# Patient Record
Sex: Male | Born: 1980 | Hispanic: No | Marital: Married | Smoking: Current every day smoker
Health system: Southern US, Community
[De-identification: ages and names within clinical notes are randomized; demographics above are authoritative.]

## PROBLEM LIST (undated history)

## (undated) DIAGNOSIS — F32A Depression, unspecified: Secondary | ICD-10-CM

## (undated) DIAGNOSIS — K219 Gastro-esophageal reflux disease without esophagitis: Secondary | ICD-10-CM

## (undated) DIAGNOSIS — F329 Major depressive disorder, single episode, unspecified: Secondary | ICD-10-CM

## (undated) HISTORY — DX: Gastro-esophageal reflux disease without esophagitis: K21.9

---

## 2012-07-30 ENCOUNTER — Encounter: Payer: Self-pay | Admitting: Endocrinology

## 2012-07-30 ENCOUNTER — Ambulatory Visit (INDEPENDENT_AMBULATORY_CARE_PROVIDER_SITE_OTHER): Payer: Self-pay | Admitting: Endocrinology

## 2012-07-30 VITALS — BP 130/74 | HR 78 | Wt 191.0 lb

## 2012-07-30 DIAGNOSIS — N529 Male erectile dysfunction, unspecified: Secondary | ICD-10-CM

## 2012-07-30 NOTE — Progress Notes (Signed)
  Subjective:    Patient ID: Paul Coleman, male    DOB: 1980-11-26, 32 y.o.   MRN: [id number]  HPI Pt states 6-12 mos of slight swelling at the breast areas bilaterally, and assoc fatigue.   Past Medical History  Diagnosis Date  . GERD (gastroesophageal reflux disease)     History reviewed. No pertinent past surgical history.  History   Social History  . Marital Status: Married    Spouse Name: N/A    Number of Children: N/A  . Years of Education: N/A   Occupational History  . Not on file.   Social History Main Topics  . Smoking status: Former Games developer  . Smokeless tobacco: Not on file  . Alcohol Use: No  . Drug Use: No  . Sexually Active: Not on file   Other Topics Concern  . Not on file   Social History Narrative  . No narrative on file    No current outpatient prescriptions on file prior to visit.   No current facility-administered medications on file prior to visit.    Not on File  History reviewed. No pertinent family history.  BP 130/74  Pulse 78  Wt 191 lb (86.637 kg)  SpO2 98%    Review of Systems denies numbness, fever, dysuria, easy bruising, sob, rash, blurry vision, rhinorrhea, chest pain.  He has urinary frequency, ED sxs, weight gain, muscle weakness, headache, excessive diaphoresis, and depression.    Objective:   Physical Exam VS: see vs page GEN: no distress HEAD: head: no deformity eyes: no periorbital swelling, no proptosis.   external nose and ears are normal mouth: no lesion seen NECK: supple, thyroid is not enlarged CHEST WALL: no deformity LUNGS: clear to auscultation BREASTS:  Slight bilat pseudogynecomastia CV: reg rate and rhythm, no murmur ABD: abdomen is soft, nontender.  no hepatosplenomegaly.  not distended.  no hernia GENITALIA:  Normal male.   MUSCULOSKELETAL: muscle bulk and strength are grossly normal.  no obvious joint swelling.  gait is normal and steady EXTEMITIES: no deformity.  no edema PULSES: dorsalis  pedis intact bilat.  no carotid bruit NEURO:  cn 2-12 grossly intact.   readily moves all 4's.  sensation is intact to touch on all 4's SKIN:  Normal texture and temperature.  No rash or suspicious lesion is visible.  Normal hair distribution. NODES:  None palpable at the neck PSYCH: alert, oriented x3.  Does not appear anxious nor depressed.  Lab Results  Component Value Date   TESTOSTERONE 273.73* 07/30/2012      Assessment & Plan:  Hypogonadism, new, uncertain etiology Mild sensation at the breast areas, possibly due to hypogonadism ED sxs, possibly due to hypogonadism

## 2012-07-30 NOTE — Patient Instructions (Addendum)
blood tests are being requested for you today.  We'll contact you with results.  

## 2012-08-04 ENCOUNTER — Ambulatory Visit: Payer: Self-pay

## 2012-08-04 DIAGNOSIS — E291 Testicular hypofunction: Secondary | ICD-10-CM

## 2012-08-05 LAB — LUTEINIZING HORMONE: LH: 6.92 m[IU]/mL (ref 1.50–9.30)

## 2012-08-07 ENCOUNTER — Telehealth: Payer: Self-pay | Admitting: Endocrinology

## 2012-08-07 ENCOUNTER — Other Ambulatory Visit: Payer: Self-pay | Admitting: Endocrinology

## 2012-08-07 MED ORDER — CLOMIPHENE CITRATE 50 MG PO TABS: ORAL_TABLET | ORAL | Status: DC

## 2012-08-07 NOTE — Telephone Encounter (Signed)
[facility]  advised

## 2012-08-07 NOTE — Telephone Encounter (Signed)
[facility] [place]

## 2012-08-07 NOTE — Telephone Encounter (Signed)
i sent rx 

## 2012-08-07 NOTE — Telephone Encounter (Signed)
please call patient: i need to know your pharmacy, so i can send rx

## 2012-08-07 NOTE — Telephone Encounter (Signed)
This is the starting dosage in men

## 2012-08-07 NOTE — Telephone Encounter (Signed)
Per pharmacist this rx is ususally not prescribed in males and dosage is ususally 25-50?, please advise

## 2012-08-07 NOTE — Telephone Encounter (Signed)
please call regarding script, needs to confirm dosage. Please call [facility] [phone].

## 2012-08-10 MED ORDER — CLOMIPHENE CITRATE 50 MG PO TABS: ORAL_TABLET | ORAL | Status: DC

## 2012-09-05 ENCOUNTER — Ambulatory Visit (INDEPENDENT_AMBULATORY_CARE_PROVIDER_SITE_OTHER): Payer: BC Managed Care – PPO | Admitting: Endocrinology

## 2012-09-05 ENCOUNTER — Encounter: Payer: Self-pay | Admitting: Endocrinology

## 2012-09-05 VITALS — BP 134/80 | HR 110 | Wt 185.0 lb

## 2012-09-05 DIAGNOSIS — E291 Testicular hypofunction: Secondary | ICD-10-CM

## 2012-09-05 LAB — HCG, QUANTITATIVE, PREGNANCY: hCG, Beta Chain, Quant, S: 0.37 m[IU]/mL

## 2012-09-05 LAB — TESTOSTERONE: Testosterone: 563.74 ng/dL (ref 350.00–890.00)

## 2012-09-05 NOTE — Patient Instructions (Addendum)
blood tests are being requested for you today.  We'll contact you with results.   Please return in 1 year.   

## 2012-09-05 NOTE — Progress Notes (Signed)
  Subjective:    Patient ID: Paul Coleman, male    DOB: 21-Jul-1980, 32 y.o.   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  He feels slightly better in general including ED sxs.   Past Medical History  Diagnosis Date  . GERD (gastroesophageal reflux disease)     No past surgical history on file.  History   Social History  . Marital Status: Married    Spouse Name: N/A    Number of Children: N/A  . Years of Education: N/A   Occupational History  . Not on file.   Social History Main Topics  . Smoking status: Former Games developer  . Smokeless tobacco: Not on file  . Alcohol Use: No  . Drug Use: No  . Sexually Active: Not on file   Other Topics Concern  . Not on file   Social History Narrative  . No narrative on file    Current Outpatient Prescriptions on File Prior to Visit  Medication Sig Dispense Refill  . clomiPHENE (CLOMID) 50 MG tablet 1/4 tab daily  10 tablet  11   No current facility-administered medications on file prior to visit.   Not on File  No family history on file.  BP 134/80  Pulse 110  Wt 185 lb (83.915 kg)  SpO2 97%  Review of Systems Denies breast pain.     Objective:   Physical Exam VITAL SIGNS:  See vs page. GENERAL: no distress. Ext: no edema.     Assessment & Plan:  Hypogonadism, on rx

## 2012-09-09 ENCOUNTER — Encounter: Payer: Self-pay | Admitting: Endocrinology

## 2012-11-14 ENCOUNTER — Ambulatory Visit (INDEPENDENT_AMBULATORY_CARE_PROVIDER_SITE_OTHER): Payer: BC Managed Care – PPO | Admitting: Endocrinology

## 2012-11-14 ENCOUNTER — Encounter: Payer: Self-pay | Admitting: Endocrinology

## 2012-11-14 VITALS — BP 130/88 | HR 108 | Temp 99.5°F | Resp 12 | Ht 69.0 in | Wt 185.0 lb

## 2012-11-14 DIAGNOSIS — E291 Testicular hypofunction: Secondary | ICD-10-CM

## 2012-11-14 MED ORDER — SILDENAFIL CITRATE 20 MG PO TABS: ORAL_TABLET | ORAL | Status: DC

## 2012-11-14 NOTE — Progress Notes (Signed)
  Subjective:    Patient ID: Paul Coleman, male    DOB: Oct 26, 1980, 32 y.o.   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  He tolerates clomid well. He feels slightly better in general including ED sxs.  However, he now reports recurrence of fatigue, decreased libido, and ED sxs.   Past Medical History  Diagnosis Date  . GERD (gastroesophageal reflux disease)     History reviewed. No pertinent past surgical history.  History   Social History  . Marital Status: Married    Spouse Name: N/A    Number of Children: N/A  . Years of Education: N/A   Occupational History  . Not on file.   Social History Main Topics  . Smoking status: Current Every Day Smoker  . Smokeless tobacco: Not on file  . Alcohol Use: No  . Drug Use: No  . Sexually Active: Not on file   Other Topics Concern  . Not on file   Social History Narrative  . No narrative on file    Current Outpatient Prescriptions on File Prior to Visit  Medication Sig Dispense Refill  . clomiPHENE (CLOMID) 50 MG tablet 1/4 tab daily  10 tablet  11   No current facility-administered medications on file prior to visit.    Not on File  History reviewed. No pertinent family history.  BP 130/88  Pulse 108  Temp(Src) 99.5 F (37.5 C) (Oral)  Resp 12  Ht 5\' 9"  (1.753 m)  Wt 185 lb (83.915 kg)  BMI 27.31 kg/m2  SpO2 97%  Review of Systems Denies gynecomastia, but he has excessive diaphoresis.    Objective:   Physical Exam VITAL SIGNS:  See vs page GENERAL: no distress GENITALIA:  Normal male testicles, scrotum, and penis   outside test results are reviewed: Estradiol=66 pg/ml Lab Results  Component Value Date   TESTOSTERONE 722.57 11/14/2012       Assessment & Plan:  Hypogonadism,  well-controlled ED, not due to hypogonadism Fatigue, not due to hypogonadism.

## 2012-11-14 NOTE — Patient Instructions (Addendum)
blood tests are being requested for you today.  We'll contact you with results. Here is a prescription for "viagra," to take as needed.   Please see a family doctor for your symptoms.   Please return in 1 year.

## 2013-04-02 ENCOUNTER — Other Ambulatory Visit: Payer: Self-pay

## 2013-04-20 ENCOUNTER — Ambulatory Visit: Payer: BC Managed Care – PPO | Admitting: Endocrinology

## 2013-04-20 DIAGNOSIS — Z0289 Encounter for other administrative examinations: Secondary | ICD-10-CM

## 2013-06-09 ENCOUNTER — Emergency Department (HOSPITAL_COMMUNITY)
Admission: EM | Admit: 2013-06-09 | Discharge: 2013-06-09 | Disposition: A | Discharge status: Home / Self Care | Payer: BC Managed Care – PPO | Attending: Emergency Medicine | Admitting: Emergency Medicine

## 2013-06-09 ENCOUNTER — Encounter (HOSPITAL_COMMUNITY): Payer: Self-pay | Admitting: Emergency Medicine

## 2013-06-09 ENCOUNTER — Emergency Department (HOSPITAL_COMMUNITY): Payer: BC Managed Care – PPO

## 2013-06-09 DIAGNOSIS — R112 Nausea with vomiting, unspecified: Secondary | ICD-10-CM | POA: Insufficient documentation

## 2013-06-09 DIAGNOSIS — Z8719 Personal history of other diseases of the digestive system: Secondary | ICD-10-CM | POA: Insufficient documentation

## 2013-06-09 DIAGNOSIS — R42 Dizziness and giddiness: Secondary | ICD-10-CM | POA: Insufficient documentation

## 2013-06-09 DIAGNOSIS — R5381 Other malaise: Secondary | ICD-10-CM | POA: Insufficient documentation

## 2013-06-09 DIAGNOSIS — E860 Dehydration: Secondary | ICD-10-CM | POA: Insufficient documentation

## 2013-06-09 DIAGNOSIS — R51 Headache: Secondary | ICD-10-CM | POA: Insufficient documentation

## 2013-06-09 DIAGNOSIS — R5383 Other fatigue: Secondary | ICD-10-CM

## 2013-06-09 DIAGNOSIS — R350 Frequency of micturition: Secondary | ICD-10-CM | POA: Insufficient documentation

## 2013-06-09 DIAGNOSIS — F17200 Nicotine dependence, unspecified, uncomplicated: Secondary | ICD-10-CM | POA: Insufficient documentation

## 2013-06-09 DIAGNOSIS — H53149 Visual discomfort, unspecified: Secondary | ICD-10-CM | POA: Insufficient documentation

## 2013-06-09 DIAGNOSIS — R630 Anorexia: Secondary | ICD-10-CM | POA: Insufficient documentation

## 2013-06-09 DIAGNOSIS — Z8711 Personal history of peptic ulcer disease: Secondary | ICD-10-CM | POA: Insufficient documentation

## 2013-06-09 LAB — BASIC METABOLIC PANEL
BUN: 8 mg/dL (ref 6–23)
CALCIUM: 8.6 mg/dL (ref 8.4–10.5)
CO2: 23 mEq/L (ref 19–32)
Chloride: 100 mEq/L (ref 96–112)
Creatinine, Ser: 0.91 mg/dL (ref 0.50–1.35)
GLUCOSE: 94 mg/dL (ref 70–99)
POTASSIUM: 4.3 meq/L (ref 3.7–5.3)
Sodium: 138 mEq/L (ref 137–147)

## 2013-06-09 LAB — URINALYSIS, ROUTINE W REFLEX MICROSCOPIC
Bilirubin Urine: NEGATIVE
Glucose, UA: NEGATIVE mg/dL
Hgb urine dipstick: NEGATIVE
Ketones, ur: NEGATIVE mg/dL
Leukocytes, UA: NEGATIVE
Nitrite: NEGATIVE
PROTEIN: NEGATIVE mg/dL
Specific Gravity, Urine: 1.003 — ABNORMAL LOW (ref 1.005–1.030)
UROBILINOGEN UA: 0.2 mg/dL (ref 0.0–1.0)
pH: 7 (ref 5.0–8.0)

## 2013-06-09 LAB — CBC WITH DIFFERENTIAL/PLATELET
BASOS PCT: 0 % (ref 0–1)
Basophils Absolute: 0 10*3/uL (ref 0.0–0.1)
EOS ABS: 0.1 10*3/uL (ref 0.0–0.7)
EOS PCT: 1 % (ref 0–5)
HCT: 46.3 % (ref 39.0–52.0)
HEMOGLOBIN: 16.5 g/dL (ref 13.0–17.0)
LYMPHS ABS: 3.4 10*3/uL (ref 0.7–4.0)
Lymphocytes Relative: 38 % (ref 12–46)
MCH: 30.7 pg (ref 26.0–34.0)
MCHC: 35.6 g/dL (ref 30.0–36.0)
MCV: 86.2 fL (ref 78.0–100.0)
Monocytes Absolute: 1 10*3/uL (ref 0.1–1.0)
Monocytes Relative: 10 % (ref 3–12)
NEUTROS PCT: 51 % (ref 43–77)
Neutro Abs: 4.7 10*3/uL (ref 1.7–7.7)
Platelets: 259 10*3/uL (ref 150–400)
RBC: 5.37 MIL/uL (ref 4.22–5.81)
RDW: 15.2 % (ref 11.5–15.5)
WBC: 9.2 10*3/uL (ref 4.0–10.5)

## 2013-06-09 LAB — POCT I-STAT, CHEM 8
BUN: 10 mg/dL (ref 6–23)
CALCIUM ION: 1.14 mmol/L (ref 1.12–1.23)
CHLORIDE: 103 meq/L (ref 96–112)
Creatinine, Ser: 1.1 mg/dL (ref 0.50–1.35)
Glucose, Bld: 120 mg/dL — ABNORMAL HIGH (ref 70–99)
HEMATOCRIT: 51 % (ref 39.0–52.0)
Hemoglobin: 17.3 g/dL — ABNORMAL HIGH (ref 13.0–17.0)
Potassium: 3.3 mEq/L — ABNORMAL LOW (ref 3.7–5.3)
Sodium: 143 mEq/L (ref 137–147)
TCO2: 27 mmol/L (ref 0–100)

## 2013-06-09 LAB — HEPATIC FUNCTION PANEL
ALT: 16 U/L (ref 0–53)
AST: 31 U/L (ref 0–37)
Albumin: 4 g/dL (ref 3.5–5.2)
Alkaline Phosphatase: 37 U/L — ABNORMAL LOW (ref 39–117)
Total Bilirubin: 0.4 mg/dL (ref 0.3–1.2)
Total Protein: 7.2 g/dL (ref 6.0–8.3)

## 2013-06-09 LAB — GLUCOSE, CAPILLARY: GLUCOSE-CAPILLARY: 97 mg/dL (ref 70–99)

## 2013-06-09 MED ORDER — PROMETHAZINE HCL 25 MG PO TABS: 25.0000 mg | ORAL_TABLET | Freq: Four times a day (QID) | ORAL | Status: DC | PRN

## 2013-06-09 MED ORDER — ONDANSETRON 8 MG PO TBDP: ORAL_TABLET | ORAL | Status: DC

## 2013-06-09 MED ORDER — ONDANSETRON HCL 4 MG/2ML IJ SOLN
4.0000 mg | Freq: Once | INTRAMUSCULAR | Status: AC
  Administered 2013-06-09: 4 mg via INTRAVENOUS
  Filled 2013-06-09: qty 2

## 2013-06-09 MED ORDER — PROCHLORPERAZINE EDISYLATE 5 MG/ML IJ SOLN
10.0000 mg | Freq: Once | INTRAMUSCULAR | Status: AC
  Administered 2013-06-09: 10 mg via INTRAVENOUS
  Filled 2013-06-09: qty 2

## 2013-06-09 MED ORDER — DIPHENHYDRAMINE HCL 50 MG/ML IJ SOLN
25.0000 mg | Freq: Once | INTRAMUSCULAR | Status: AC
  Administered 2013-06-09: 25 mg via INTRAVENOUS
  Filled 2013-06-09: qty 1

## 2013-06-09 MED ORDER — SODIUM CHLORIDE 0.9 % IV BOLUS (SEPSIS)
1000.0000 mL | Freq: Once | INTRAVENOUS | Status: AC
  Administered 2013-06-09: 1000 mL via INTRAVENOUS

## 2013-06-09 MED ORDER — PANTOPRAZOLE SODIUM 40 MG IV SOLR
40.0000 mg | Freq: Once | INTRAVENOUS | Status: AC
  Administered 2013-06-09: 40 mg via INTRAVENOUS
  Filled 2013-06-09: qty 40

## 2013-06-09 NOTE — Discharge Instructions (Signed)

## 2013-06-09 NOTE — ED Notes (Signed)
Pt states he was in ED yesterday for nausea vomitting. States today he began to feel dizzy and has a headache. Reports he believes he is dehydrated. Pt denies any difficulty urinating but states he has had urinary frequency starting today.

## 2013-06-09 NOTE — ED Notes (Signed)
Pt tolerated ginger ale and iced water--- pt denies nausea at this time.

## 2013-06-09 NOTE — Discharge Instructions (Signed)
You were seen and evaluated for uric nausea vomiting symptoms. You were treated with IV fluids and medications and emergency department. At this time your providers do not feel your symptoms are caused by any emergent condition. Please use the Zofran or Phenergan (only buy one) prescription provided to help with your nausea vomiting symptoms.   Nausea and Vomiting Nausea is a sick feeling that often comes before throwing up (vomiting). Vomiting is a reflex where stomach contents come out of your mouth. Vomiting can cause severe loss of body fluids (dehydration). Children and elderly adults can become dehydrated quickly, especially if they also have diarrhea. Nausea and vomiting are symptoms of a condition or disease. It is important to find the cause of your symptoms. CAUSES   Direct irritation of the stomach lining. This irritation can result from increased acid production (gastroesophageal reflux disease), infection, food poisoning, taking certain medicines (such as nonsteroidal anti-inflammatory drugs), alcohol use, or tobacco use.  Signals from the brain.These signals could be caused by a headache, heat exposure, an inner ear disturbance, increased pressure in the brain from injury, infection, a tumor, or a concussion, pain, emotional stimulus, or metabolic problems.  An obstruction in the gastrointestinal tract (bowel obstruction).  Illnesses such as diabetes, hepatitis, gallbladder problems, appendicitis, kidney problems, cancer, sepsis, atypical symptoms of a heart attack, or eating disorders.  Medical treatments such as chemotherapy and radiation.  Receiving medicine that makes you sleep (general anesthetic) during surgery. DIAGNOSIS Your caregiver may ask for tests to be done if the problems do not improve after a few days. Tests may also be done if symptoms are severe or if the reason for the nausea and vomiting is not clear. Tests may include:  Urine tests.  Blood tests.  Stool  tests.  Cultures (to look for evidence of infection).  X-rays or other imaging studies. Test results can help your caregiver make decisions about treatment or the need for additional tests. TREATMENT You need to stay well hydrated. Drink frequently but in small amounts.You may wish to drink water, sports drinks, clear broth, or eat frozen ice pops or gelatin dessert to help stay hydrated.When you eat, eating slowly may help prevent nausea.There are also some antinausea medicines that may help prevent nausea. HOME CARE INSTRUCTIONS   Take all medicine as directed by your caregiver.  If you do not have an appetite, do not force yourself to eat. However, you must continue to drink fluids.  If you have an appetite, eat a normal diet unless your caregiver tells you differently.  Eat a variety of complex carbohydrates (rice, wheat, potatoes, bread), lean meats, yogurt, fruits, and vegetables.  Avoid high-fat foods because they are more difficult to digest.  Drink enough water and fluids to keep your urine clear or pale yellow.  If you are dehydrated, ask your caregiver for specific rehydration instructions. Signs of dehydration may include:  Severe thirst.  Dry lips and mouth.  Dizziness.  Dark urine.  Decreasing urine frequency and amount.  Confusion.  Rapid breathing or pulse. SEEK IMMEDIATE MEDICAL CARE IF:   You have blood or brown flecks (like coffee grounds) in your vomit.  You have black or bloody stools.  You have a severe headache or stiff neck.  You are confused.  You have severe abdominal pain.  You have chest pain or trouble breathing.  You do not urinate at least once every 8 hours.  You develop cold or clammy skin.  You continue to vomit for longer than  24 to 48 hours.  You have a fever. MAKE SURE YOU:   Understand these instructions.  Will watch your condition.  Will get help right away if you are not doing well or get worse. Document  Released: 05/14/2005 Document Revised: 08/06/2011 Document Reviewed: 10/11/2010 [facility] Patient Information 2014 [place], [place].

## 2013-06-09 NOTE — ED Notes (Signed)
Pt states he has been vomiting since about 3pm today  Pt states he is unable to hold anything down  Denies abd pain

## 2013-06-09 NOTE — ED Provider Notes (Signed)
Medical screening examination/treatment/procedure(s) were performed by non-physician practitioner and as supervising physician I was immediately available for consultation/collaboration.  EKG Interpretation   None         Meshia Rau M Flonnie Wierman, DO 06/09/13 1514 

## 2013-06-09 NOTE — ED Provider Notes (Signed)
CSN: [id number]     Arrival date & time 06/09/13  2033 History   First MD Initiated Contact with Patient 06/09/13 2041     Chief Complaint  Patient presents with  . Headache  . Dizziness   (Consider location/radiation/quality/duration/timing/severity/associated sxs/prior Treatment) HPI Comments: 33 year old male presents with acute lightheadedness, frontal headache, dry mouth, urinary frequency, and feeling "dehydrated". He states it started one hour or two prior to arrival. He was seen in the ER here 24 hours ago for acute vomiting and was able to be discharged home. He states the vomiting is resolved but he felt nauseous when he went to work later in the day so he went home to rest. Then he acutely felt his other above symptoms. Denies any chest pain, shortness of breath, abdominal pain, back pain, or focal weakness. He states he feels diffusely weak. He does have some photophobia. His never had headaches like this before the pain is currently an 8/10. He has not noticed any slurred speech or numbness.   Past Medical History  Diagnosis Date  . GERD (gastroesophageal reflux disease)    History reviewed. No pertinent past surgical history. History reviewed. No pertinent family history. History  Substance Use Topics  . Smoking status: Current Every Day Smoker  . Smokeless tobacco: Not on file  . Alcohol Use: No    Review of Systems  Constitutional: Negative for fever and chills.  HENT: Negative for congestion.   Eyes: Positive for photophobia. Negative for visual disturbance.  Respiratory: Negative for shortness of breath.   Cardiovascular: Negative for chest pain.  Gastrointestinal: Positive for nausea and vomiting. Negative for abdominal pain and diarrhea.  Genitourinary: Positive for frequency. Negative for dysuria and decreased urine volume.  Musculoskeletal: Negative for back pain.  Neurological: Positive for dizziness, weakness and headaches. Negative for speech difficulty and  numbness.  All other systems reviewed and are negative.    Allergies  Review of patient's allergies indicates no known allergies.  Home Medications   Current Outpatient Rx  Name  Route  Sig  Dispense  Refill  . bismuth subsalicylate (PEPTO BISMOL) 262 MG/15ML suspension   Oral   Take 30 mLs by mouth every 6 (six) hours as needed for indigestion.          . ondansetron (ZOFRAN-ODT) 8 MG disintegrating tablet   Oral   Take 8 mg by mouth every 8 (eight) hours as needed for nausea or vomiting.         . promethazine (PHENERGAN) 25 MG tablet   Oral   Take 1 tablet (25 mg total) by mouth every 6 (six) hours as needed for nausea.   20 tablet   0    BP 124/75  Pulse 73  Temp(Src) 98.7 F (37.1 C) (Oral)  Resp 24  SpO2 97% Physical Exam  Nursing note and vitals reviewed. Constitutional: He is oriented to person, place, and time. He appears well-developed and well-nourished. No distress.  HENT:  Head: Normocephalic and atraumatic.  Right Ear: External ear normal.  Left Ear: External ear normal.  Nose: Nose normal.  Eyes: EOM are normal. Pupils are equal, round, and reactive to light. Right eye exhibits no discharge. Left eye exhibits no discharge.  Neck: Normal range of motion and full passive range of motion without pain. Neck supple. No rigidity.  Cardiovascular: Normal rate, regular rhythm, normal heart sounds and intact distal pulses.   No murmur heard. Pulmonary/Chest: Effort normal and breath sounds normal.  Abdominal: Soft. There is  no tenderness.  Musculoskeletal: He exhibits no edema.  Neurological: He is alert and oriented to person, place, and time. He has normal strength and normal reflexes. No cranial nerve deficit or sensory deficit. He exhibits normal muscle tone. Gait normal. GCS eye subscore is 4. GCS verbal subscore is 5. GCS motor subscore is 6.  Reflex Scores:      Bicep reflexes are 2+ on the right side and 2+ on the left side.      Patellar reflexes  are 2+ on the right side and 2+ on the left side. 5/5 strength in all 4 extremities. Normal gait. Normal Cerebellar testing. CN 2-12 grossly intact. When asked to smile the corners of both mouths open appropriately, then the right returns to place, almost like he can't hold it in a smile. No facial droop.   Skin: Skin is warm and dry.    ED Course  Procedures (including critical care time) Labs Review Labs Reviewed  HEPATIC FUNCTION PANEL - Abnormal; Notable for the following:    Alkaline Phosphatase 37 (*)    All other components within normal limits  URINALYSIS, ROUTINE W REFLEX MICROSCOPIC - Abnormal; Notable for the following:    Specific Gravity, Urine 1.003 (*)    All other components within normal limits  CBC WITH DIFFERENTIAL  BASIC METABOLIC PANEL  GLUCOSE, CAPILLARY   Imaging Review Ct Head Wo Contrast  06/09/2013   CLINICAL DATA:  Headaches with photosensitivity  EXAM: CT HEAD WITHOUT CONTRAST  TECHNIQUE: Contiguous axial images were obtained from the base of the skull through the vertex without intravenous contrast.  COMPARISON:  None.  FINDINGS: The bony calvarium is intact. The ventricles are normal in size and configuration. No acute hemorrhage, acute infarction or space-occupying mass lesion are noted.  IMPRESSION: No acute abnormality noted.   Electronically Signed   By: Alcide CleverMark  Lukens M.D.   On: 06/09/2013 22:00    EKG Interpretation    Date/Time:  Tuesday June 09 2013 21:46:24 EST Ventricular Rate:  83 PR Interval:  167 QRS Duration: 95 QT Interval:  370 QTC Calculation: 435 R Axis:   89 Text Interpretation:  Sinus rhythm Borderline T wave abnormalities No old tracing to compare Confirmed by Pacer Dorn  MD, Chablis Losh ([id number]) on 06/09/2013 10:04:46 PM            MDM   1. Lightheadedness   2. Headache  Patient is well appearing here, has normal vitals. Has atypical smile as above. I discussed this over the phone with Dr. Amada JupiterKirkpatrick (neuro on call), who  states that since his muscles move initially and there is no droop, this is not c/w a stroke. More likely a migraine or complex migraine, and he recommends treating as such. Neuro exam otherwise completely normal, and he is describing a fainting feeling, no vertigo like symptoms. He has a negative workup here, with no obvious cause on EKG, normal CT and benign labs I feel that this is likely a complex migraine vs viral syndrome with his recent vomiting. His headache and other symptoms improved, as did his smile. I feel this patient is stable for discharge at this time, and he feels improved and wants to go home. Discussed important return precautions with the patient.     Audree CamelScott T Divit Stipp, MD 06/09/13 2329

## 2013-06-09 NOTE — ED Provider Notes (Signed)
CSN: [id number]     Arrival date & time 06/09/13  0043 History   First MD Initiated Contact with Patient 06/09/13 0109     Chief Complaint  Patient presents with  . Emesis   HPI  History provided by the patient. The patient is a 33 year old male with history of GERD and peptic ulcers who presents with symptoms of nausea and vomiting. Patient states that he began having nausea and vomiting acutely around 3 PM in the afternoon. He was feeling well earlier in the day. He had normal appetite and had pizza for lunch. Since that time he reports that he has not been able to keep down any kinds of food or fluids. He reports at least 10 episodes of vomiting after eating and drinking. He denies any associated diarrhea. Denies any abdominal pains. No fever, chills or sweats. Denies any recent travel. No known sick contacts. No other associated symptoms. Patient did use some Pepto-Bismol without any improvement. No other aggravating or alleviating factors.    Past Medical History  Diagnosis Date  . GERD (gastroesophageal reflux disease)    History reviewed. No pertinent past surgical history. History reviewed. No pertinent family history. History  Substance Use Topics  . Smoking status: Current Every Day Smoker  . Smokeless tobacco: Not on file  . Alcohol Use: No    Review of Systems  Constitutional: Positive for appetite change. Negative for fever, chills and diaphoresis.  Respiratory: Negative for cough and shortness of breath.   Gastrointestinal: Positive for nausea and vomiting. Negative for abdominal pain, diarrhea and constipation.  Skin: Negative for rash.  All other systems reviewed and are negative.    Allergies  Review of patient's allergies indicates no known allergies.  Home Medications   Current Outpatient Rx  Name  Route  Sig  Dispense  Refill  . bismuth subsalicylate (PEPTO BISMOL) 262 MG/15ML suspension   Oral   Take 30 mLs by mouth every 6 (six) hours as needed.         BP 138/85  Pulse 89  Temp(Src) 97.9 F (36.6 C) (Oral)  Resp 20  Ht 5\' 8"  (1.727 m)  Wt 180 lb (81.647 kg)  BMI 27.38 kg/m2  SpO2 99% Physical Exam  Nursing note and vitals reviewed. Constitutional: He is oriented to person, place, and time. He appears well-developed and well-nourished. No distress.  HENT:  Head: Normocephalic and atraumatic.  Mouth/Throat: Oropharynx is clear and moist.  Neck: Normal range of motion. Neck supple.  No meningeal signs  Cardiovascular: Normal rate and regular rhythm.   Pulmonary/Chest: Effort normal and breath sounds normal. No respiratory distress. He has no wheezes. He has no rales.  Abdominal: Soft. He exhibits no distension. There is no tenderness. There is no rebound and no guarding.  No CVA tenderness  Neurological: He is alert and oriented to person, place, and time.  Skin: Skin is warm.  Psychiatric: He has a normal mood and affect.    ED Course  Procedures   DIAGNOSTIC STUDIES: Oxygen Saturation is 99% on room air.    COORDINATION OF CARE:  Nursing notes reviewed. Vital signs reviewed. Initial pt interview and examination performed.   1:21 AM-patient seen and evaluated. Patient appears well no acute distress. He has a soft benign abdominal exam. Does not appear severely dehydrated. Patient does not have any diarrhea however symptoms most likely related to viral gastroenteritis although with his history he may have some acute gastritis irritations. Discussed work up plan with pt at  bedside, which includes i-STAT Chem-8. Pt agrees with plan.  Patient feeling better after treatments in the emergency department. He is able to tolerate by mouth fluids. At this time we'll discharge with symptomatic treatment for his nausea and vomiting.  Treatment plan initiated: Medications  sodium chloride 0.9 % bolus 1,000 mL (not administered)  ondansetron (ZOFRAN) injection 4 mg (not administered)  pantoprazole (PROTONIX) injection 40 mg (not  administered)   Results for orders placed during the hospital encounter of 06/09/13  POCT I-STAT, CHEM 8      Result Value Range   Sodium 143  137 - 147 mEq/L   Potassium 3.3 (*) 3.7 - 5.3 mEq/L   Chloride 103  96 - 112 mEq/L   BUN 10  6 - 23 mg/dL   Creatinine, Ser 4.011.10  0.50 - 1.35 mg/dL   Glucose, Bld 027120 (*) 70 - 99 mg/dL   Calcium, Ion 2.531.14  6.641.12 - 1.23 mmol/L   TCO2 27  0 - 100 mmol/L   Hemoglobin 17.3 (*) 13.0 - 17.0 g/dL   HCT 40.351.0  47.439.0 - 25.952.0 %      MDM   1. Nausea & vomiting        Angus Sellereter S Andrw Mcguirt, PA-C 06/09/13 0210

## 2013-06-09 NOTE — ED Notes (Signed)
Patient transported to CT 

## 2013-06-11 ENCOUNTER — Encounter (HOSPITAL_COMMUNITY): Payer: Self-pay | Admitting: Emergency Medicine

## 2013-06-11 ENCOUNTER — Emergency Department (HOSPITAL_COMMUNITY)
Admission: EM | Admit: 2013-06-11 | Discharge: 2013-06-12 | Disposition: A | Discharge status: Home / Self Care | Payer: BC Managed Care – PPO | Attending: Emergency Medicine | Admitting: Emergency Medicine

## 2013-06-11 ENCOUNTER — Emergency Department (HOSPITAL_COMMUNITY): Payer: BC Managed Care – PPO

## 2013-06-11 DIAGNOSIS — K219 Gastro-esophageal reflux disease without esophagitis: Secondary | ICD-10-CM | POA: Insufficient documentation

## 2013-06-11 DIAGNOSIS — Z79899 Other long term (current) drug therapy: Secondary | ICD-10-CM | POA: Insufficient documentation

## 2013-06-11 DIAGNOSIS — F17200 Nicotine dependence, unspecified, uncomplicated: Secondary | ICD-10-CM | POA: Insufficient documentation

## 2013-06-11 DIAGNOSIS — J069 Acute upper respiratory infection, unspecified: Secondary | ICD-10-CM | POA: Insufficient documentation

## 2013-06-11 LAB — COMPREHENSIVE METABOLIC PANEL
ALBUMIN: 4.2 g/dL (ref 3.5–5.2)
ALT: 19 U/L (ref 0–53)
AST: 16 U/L (ref 0–37)
Alkaline Phosphatase: 44 U/L (ref 39–117)
BILIRUBIN TOTAL: 0.4 mg/dL (ref 0.3–1.2)
BUN: 7 mg/dL (ref 6–23)
CHLORIDE: 102 meq/L (ref 96–112)
CO2: 27 mEq/L (ref 19–32)
CREATININE: 0.95 mg/dL (ref 0.50–1.35)
Calcium: 9 mg/dL (ref 8.4–10.5)
GFR calc Af Amer: >90 mL/min (ref >90)
GFR calc non Af Amer: >90 mL/min (ref >90)
Glucose, Bld: 103 mg/dL — ABNORMAL HIGH (ref 70–99)
Potassium: 3.9 mEq/L (ref 3.7–5.3)
Sodium: 142 mEq/L (ref 137–147)
Total Protein: 7.2 g/dL (ref 6.0–8.3)

## 2013-06-11 LAB — CBC WITH DIFFERENTIAL/PLATELET
BASOS ABS: 0 10*3/uL (ref 0.0–0.1)
BASOS PCT: 0 % (ref 0–1)
Eosinophils Absolute: 0.1 10*3/uL (ref 0.0–0.7)
Eosinophils Relative: 2 % (ref 0–5)
HEMATOCRIT: 46.8 % (ref 39.0–52.0)
Hemoglobin: 16.3 g/dL (ref 13.0–17.0)
LYMPHS PCT: 30 % (ref 12–46)
Lymphs Abs: 2.5 10*3/uL (ref 0.7–4.0)
MCH: 30.8 pg (ref 26.0–34.0)
MCHC: 34.8 g/dL (ref 30.0–36.0)
MCV: 88.3 fL (ref 78.0–100.0)
MONO ABS: 0.6 10*3/uL (ref 0.1–1.0)
Monocytes Relative: 7 % (ref 3–12)
NEUTROS PCT: 61 % (ref 43–77)
Neutro Abs: 5.1 10*3/uL (ref 1.7–7.7)
Platelets: 224 10*3/uL (ref 150–400)
RBC: 5.3 MIL/uL (ref 4.22–5.81)
RDW: 15.4 % (ref 11.5–15.5)
WBC: 8.3 10*3/uL (ref 4.0–10.5)

## 2013-06-11 LAB — MONONUCLEOSIS SCREEN: Mono Screen: NEGATIVE

## 2013-06-11 LAB — LIPASE, BLOOD: Lipase: 23 U/L (ref 11–59)

## 2013-06-11 MED ORDER — PROMETHAZINE HCL 25 MG PO TABS: 25.0000 mg | ORAL_TABLET | Freq: Four times a day (QID) | ORAL | Status: DC | PRN

## 2013-06-11 MED ORDER — PROMETHAZINE HCL 25 MG/ML IJ SOLN
25.0000 mg | Freq: Once | INTRAMUSCULAR | Status: AC
  Administered 2013-06-11: 25 mg via INTRAVENOUS
  Filled 2013-06-11: qty 1

## 2013-06-11 MED ORDER — AZITHROMYCIN 250 MG PO TABS: 250.0000 mg | ORAL_TABLET | Freq: Every day | ORAL | Status: DC

## 2013-06-11 MED ORDER — KETOROLAC TROMETHAMINE 30 MG/ML IJ SOLN
30.0000 mg | Freq: Once | INTRAMUSCULAR | Status: AC
  Administered 2013-06-11: 30 mg via INTRAVENOUS
  Filled 2013-06-11: qty 1

## 2013-06-11 MED ORDER — SODIUM CHLORIDE 0.9 % IV BOLUS (SEPSIS)
1000.0000 mL | Freq: Once | INTRAVENOUS | Status: AC
  Administered 2013-06-11: 1000 mL via INTRAVENOUS

## 2013-06-11 MED ORDER — PANTOPRAZOLE SODIUM 40 MG IV SOLR
40.0000 mg | Freq: Once | INTRAVENOUS | Status: AC
  Administered 2013-06-11: 40 mg via INTRAVENOUS
  Filled 2013-06-11: qty 40

## 2013-06-11 NOTE — ED Notes (Addendum)
Patient with generalized body aches, nausea, vomiting, and chills.  [facility] same [facility].  Patient states that he feels weak and vomited at least five times today  Patient was diagnosed with Viral Infection on 06/09/2013

## 2013-06-11 NOTE — Discharge Instructions (Signed)
Influenza, Adult Influenza ("the flu") is a viral infection of the respiratory tract. It occurs more often in winter months because people spend more time in close contact with one another. Influenza can make you feel very sick. Influenza easily spreads from person to person (contagious). CAUSES  Influenza is caused by a virus that infects the respiratory tract. You can catch the virus by breathing in droplets from an infected person's cough or sneeze. You can also catch the virus by touching something that was recently contaminated with the virus and then touching your mouth, nose, or eyes. SYMPTOMS  Symptoms typically last 4 to 10 days and may include:  Fever.  Chills.  Headache, body aches, and muscle ach Cool Mist Vaporizers Vaporizers may help relieve the symptoms of a cough and cold. They add moisture to the air, which helps mucus to become thinner and less sticky. This makes it easier to breathe and cough up secretions. Cool mist vaporizers do not cause serious burns like hot mist vaporizers ("steamers, humidifiers"). Vaporizers have not been proved to show they help with colds. You should not use a vaporizer if you are allergic to mold.  HOME CARE INSTRUCTIONS Follow the package instructions for the vaporizer. Do not use anything other than distilled water in the vaporizer. Do not run the vaporizer all of the time. This can cause mold or bacteria to grow in the vaporizer. Clean the vaporizer after each time it is used. Clean and dry the vaporizer well before storing it. Stop using the vaporizer if worsening respiratory symptoms develop. Document Released: 02/09/2004 Document Revised: 01/14/2013 Document Reviewed: 10/01/2012 [facility] Patient Information 2014 [place], [place].  es.  Sore throat.  Chest discomfort and cough.  Poor appetite.  Weakness or feeling tired.  Dizziness.  Nausea or vomiting. DIAGNOSIS  Diagnosis of influenza is often made based on your history and a  physical exam. A nose or throat swab test can be done to confirm the diagnosis. RISKS AND COMPLICATIONS You may be at risk for a more severe case of influenza if you smoke cigarettes, have diabetes, have chronic heart disease (such as heart failure) or lung disease (such as asthma), or if you have a weakened immune system. Elderly people and pregnant women are also at risk for more serious infections. The most common complication of influenza is a lung infection (pneumonia). Sometimes, this complication can require emergency medical care and may be life-threatening. PREVENTION  An annual influenza vaccination (flu shot) is the best way to avoid getting influenza. An annual flu shot is now routinely recommended for all adults in the [place] TREATMENT  In mild cases, influenza goes away on its own. Treatment is directed at relieving symptoms. For more severe cases, your caregiver may prescribe antiviral medicines to shorten the sickness. Antibiotic medicines are not effective, because the infection is caused by a virus, not by bacteria. HOME CARE INSTRUCTIONS  Only take over-the-counter or prescription medicines for pain, discomfort, or fever as directed by your caregiver.  Use a cool mist humidifier to make breathing easier.  Get plenty of rest until your temperature returns to normal. This usually takes 3 to 4 days.  Drink enough fluids to keep your urine clear or pale yellow.  Cover your mouth and nose when coughing or sneezing, and wash your hands well to avoid spreading the virus.  Stay home from work or school until your fever has been gone for at least 1 full day. SEEK MEDICAL CARE IF:   You have chest  pain or a deep cough that worsens or produces more mucus.  You have nausea, vomiting, or diarrhea. SEEK IMMEDIATE MEDICAL CARE IF:   You have difficulty breathing, shortness of breath, or your skin or nails turn bluish.  You have severe neck pain or stiffness.  You have a severe  headache, facial pain, or earache.  You have a worsening or recurring fever.  You have nausea or vomiting that cannot be controlled. MAKE SURE YOU:  Understand these instructions.  Will watch your condition.  Will get help right away if you are not doing well or get worse. Document Released: 05/11/2000 Document Revised: 11/13/2011 Document Reviewed: 08/13/2011 [facility] Patient Information 2014 [place].

## 2013-06-11 NOTE — ED Provider Notes (Signed)
CSN: [id number]     Arrival date & time 06/11/13  0807 History   First MD Initiated Contact with Patient 06/11/13 [phone]     Chief Complaint  Patient presents with  . Generalized Body Aches   (Consider location/radiation/quality/duration/timing/severity/associated sxs/prior Treatment) HPI  Patient presents to the ER with complaints of feeling lethargic, body aches, nausea, vomiting, and chills. [facility] the same. He was told that he has a virus and sent home with nausea medication. During these visits basic blood work was ordered and a head CT all which were not impressive.   The patient reports that this morning he woke up feeling dizzy (he has been dizzy for 4 days now), drank a little bit of water and then vomited 4 times. He says he feels weak and dehydrated. He has  Been taking the phenergan but he threw it up this morning. No new symptoms. Neg abdominal pain or neck pain. NO change in vision or headaches.   Past Medical History  Diagnosis Date  . GERD (gastroesophageal reflux disease)    History reviewed. No pertinent past surgical history. History reviewed. No pertinent family history. History  Substance Use Topics  . Smoking status: Current Every Day Smoker  . Smokeless tobacco: Not on file  . Alcohol Use: No    Review of Systems The patient denies anorexia,  weight loss,, vision loss, decreased hearing, hoarseness, chest pain, syncope, dyspnea on exertion, peripheral edema, balance deficits, hemoptysis, abdominal pain, melena, hematochezia, severe indigestion/heartburn, hematuria, incontinence, genital sores, muscle weakness, suspicious skin lesions, transient blindness, difficulty walking, depression, unusual weight change, abnormal bleeding, enlarged lymph nodes, angioedema, and breast masses.  Allergies  Review of patient's allergies indicates no known allergies.  Home Medications   Current Outpatient Rx  Name  Route  Sig  Dispense   Refill  . ondansetron (ZOFRAN-ODT) 8 MG disintegrating tablet   Oral   Take 8 mg by mouth every 8 (eight) hours as needed for nausea or vomiting.         . promethazine (PHENERGAN) 25 MG tablet   Oral   Take 1 tablet (25 mg total) by mouth every 6 (six) hours as needed for nausea.   20 tablet   0   . traMADol (ULTRAM) 50 MG tablet   Oral   Take 50-100 mg by mouth every 6 (six) hours as needed.          BP 133/87  Pulse 79  Temp(Src) 98.7 F (37.1 C) (Oral)  Resp 16  SpO2 98% Physical Exam  Nursing note and vitals reviewed. Constitutional: He appears well-developed and well-nourished. No distress.  HENT:  Head: Normocephalic and atraumatic.  Right Ear: External ear normal.  Left Ear: External ear normal.  Mouth/Throat: Oropharynx is clear and moist.  Eyes: Pupils are equal, round, and reactive to light.  Neck: Normal range of motion. Neck supple. No Brudzinski's sign and no Kernig's sign noted.  Cardiovascular: Normal rate and regular rhythm.   Pulmonary/Chest: Effort normal. No respiratory distress. He has no wheezes.  Abdominal: Soft. He exhibits no distension. There is no tenderness. There is no rebound and no guarding.  Musculoskeletal: Normal range of motion.  Neurological: He is alert.  Skin: Skin is warm and dry.    ED Course  Procedures (including critical care time) Labs Review Labs Reviewed  COMPREHENSIVE METABOLIC PANEL - Abnormal; Notable for the following:    Glucose, Bld 103 (*)    All  other components within normal limits  CBC WITH DIFFERENTIAL  LIPASE, BLOOD  MONONUCLEOSIS SCREEN   Imaging Review Ct Head Wo Contrast  06/09/2013   CLINICAL DATA:  Headaches with photosensitivity  EXAM: CT HEAD WITHOUT CONTRAST  TECHNIQUE: Contiguous axial images were obtained from the base of the skull through the vertex without intravenous contrast.  COMPARISON:  None.  FINDINGS: The bony calvarium is intact. The ventricles are normal in size and configuration.  No acute hemorrhage, acute infarction or space-occupying mass lesion are noted.  IMPRESSION: No acute abnormality noted.   Electronically Signed   By: Alcide Clever M.D.   On: 06/09/2013 22:00   Dg Abd Acute W/chest  06/11/2013   CLINICAL DATA:  Generalized body aches and vomiting.  EXAM: ACUTE ABDOMEN SERIES (ABDOMEN 2 VIEW & CHEST 1 VIEW)  COMPARISON:  None.  FINDINGS: The cardiomediastinal silhouette is within normal limits. The lungs are well inflated with mildly increased interstitial markings bilaterally. No segmental airspace consolidation is seen. There is no evidence of pleural effusion or pneumothorax.  There is no evidence of intraperitoneal free air. Gas is seen in nondilated loops of small and large bowel without evidence of obstruction. No air-fluid levels are seen. No abnormal soft tissue calcification is identified. No acute osseous abnormality is seen.  IMPRESSION: 1. Mildly increased interstitial lung markings bilaterally, nonspecific but can be seen in the setting of viral/atypical infection. 2. Nonobstructed bowel-gas pattern.   Electronically Signed   By: Sebastian Ache   On: 06/11/2013 09:13    EKG Interpretation   None       MDM   1. URI (upper respiratory infection)    Chest xray shows that patient may have a viral or atypical lung infection. Most likely this is viral but due to the patients repeated visits, fevers, body aches, cough.  Will Rx with Phenergan and Azithromycin.  I will advise patient that it will take anywhere from 24 hours to 14 days for him to recover from the infection. No signs of dehydration, normal mono screen.  Patient can follow-up with PCP.  33 y.o.Paul Coleman's evaluation in the Emergency Department is complete. It has been determined that no acute conditions requiring further emergency intervention are present at this time. The patient/guardian have been advised of the diagnosis and plan. We have discussed signs and symptoms that warrant return  to the ED, such as changes or worsening in symptoms.  Vital signs are stable at discharge. Filed Vitals:   06/11/13 0815  BP: 133/87  Pulse: 79  Temp: 98.7 F (37.1 C)  Resp: 16    Patient/guardian has voiced understanding and agreed to follow-up with the PCP or specialist.     Dorthula Matas, PA-C 06/11/13 1008

## 2013-06-12 ENCOUNTER — Emergency Department (HOSPITAL_COMMUNITY)
Admission: EM | Admit: 2013-06-12 | Discharge: 2013-06-12 | Disposition: A | Discharge status: Home / Self Care | Payer: BC Managed Care – PPO | Attending: Emergency Medicine | Admitting: Emergency Medicine

## 2013-06-12 ENCOUNTER — Encounter (HOSPITAL_COMMUNITY): Payer: Self-pay | Admitting: Emergency Medicine

## 2013-06-12 DIAGNOSIS — R109 Unspecified abdominal pain: Secondary | ICD-10-CM | POA: Insufficient documentation

## 2013-06-12 DIAGNOSIS — Z792 Long term (current) use of antibiotics: Secondary | ICD-10-CM | POA: Insufficient documentation

## 2013-06-12 DIAGNOSIS — Z8719 Personal history of other diseases of the digestive system: Secondary | ICD-10-CM | POA: Insufficient documentation

## 2013-06-12 DIAGNOSIS — R112 Nausea with vomiting, unspecified: Secondary | ICD-10-CM | POA: Insufficient documentation

## 2013-06-12 DIAGNOSIS — F17200 Nicotine dependence, unspecified, uncomplicated: Secondary | ICD-10-CM | POA: Insufficient documentation

## 2013-06-12 NOTE — ED Provider Notes (Signed)
CSN: [id number]     Arrival date & time 06/12/13  4098 History   First MD Initiated Contact with Patient 06/12/13 1023     Chief Complaint  Patient presents with  . Nausea  . Emesis   (Consider location/radiation/quality/duration/timing/severity/associated sxs/prior Treatment) Patient is a 33 y.o. male presenting with vomiting. The history is provided by the patient.  Emesis Associated symptoms: no abdominal pain, no diarrhea and no headaches    patient presents with nausea and vomiting. His been seen for this 3 times in the last few days. Lab work and x-rays have been done. Previous x-ray showed possible viral infection versus atypical pneumonia. He states he felt better yesterday but has felt worse again today. He states he does have more vomiting. No diarrhea. His mild upper abdominal pain. No fevers. No relief with medications at home. No lightheadedness or dizziness.  Past Medical History  Diagnosis Date  . GERD (gastroesophageal reflux disease)    History reviewed. No pertinent past surgical history. No family history on file. History  Substance Use Topics  . Smoking status: Current Every Day Smoker  . Smokeless tobacco: Not on file  . Alcohol Use: No    Review of Systems  Constitutional: Negative for activity change and appetite change.  Eyes: Negative for pain.  Respiratory: Negative for chest tightness and shortness of breath.   Cardiovascular: Negative for chest pain and leg swelling.  Gastrointestinal: Positive for nausea and vomiting. Negative for abdominal pain and diarrhea.  Genitourinary: Negative for flank pain.  Musculoskeletal: Negative for back pain and neck stiffness.  Skin: Negative for rash.  Neurological: Negative for weakness, numbness and headaches.  Psychiatric/Behavioral: Negative for behavioral problems.    Allergies  Review of patient's allergies indicates no known allergies.  Home Medications   Current Outpatient Rx  Name  Route  Sig   Dispense  Refill  . azithromycin (ZITHROMAX) 250 MG tablet   Oral   Take 1 tablet (250 mg total) by mouth daily. Take first 2 tablets together, then 1 every day until finished.   6 tablet   0   . ondansetron (ZOFRAN-ODT) 8 MG disintegrating tablet   Oral   Take 8 mg by mouth every 8 (eight) hours as needed for nausea or vomiting.         . promethazine (PHENERGAN) 25 MG tablet   Oral   Take 1 tablet (25 mg total) by mouth every 6 (six) hours as needed for nausea or vomiting.   30 tablet   0   . traMADol (ULTRAM) 50 MG tablet   Oral   Take 50-100 mg by mouth every 6 (six) hours as needed.          BP 135/88  Pulse 91  Temp(Src) 98.7 F (37.1 C) (Oral)  Resp 18  SpO2 97% Physical Exam  Nursing note and vitals reviewed. Constitutional: He is oriented to person, place, and time. He appears well-developed and well-nourished.  HENT:  Head: Normocephalic and atraumatic.  Eyes: EOM are normal. Pupils are equal, round, and reactive to light.  Neck: Normal range of motion. Neck supple.  Cardiovascular: Normal rate, regular rhythm and normal heart sounds.   No murmur heard. Pulmonary/Chest: Effort normal and breath sounds normal.  Abdominal: Soft. Bowel sounds are normal. He exhibits no distension and no mass. There is no tenderness. There is no rebound and no guarding.  Musculoskeletal: Normal range of motion. He exhibits no edema.  Neurological: He is alert and oriented to person,  place, and time. No cranial nerve deficit.  Skin: Skin is warm and dry.  Psychiatric: He has a normal mood and affect.    ED Course  Procedures (including critical care time) Labs Review Labs Reviewed - No data to display Imaging Review Dg Abd Acute W/chest  06/11/2013   CLINICAL DATA:  Generalized body aches and vomiting.  EXAM: ACUTE ABDOMEN SERIES (ABDOMEN 2 VIEW & CHEST 1 VIEW)  COMPARISON:  None.  FINDINGS: The cardiomediastinal silhouette is within normal limits. The lungs are well  inflated with mildly increased interstitial markings bilaterally. No segmental airspace consolidation is seen. There is no evidence of pleural effusion or pneumothorax.  There is no evidence of intraperitoneal free air. Gas is seen in nondilated loops of small and large bowel without evidence of obstruction. No air-fluid levels are seen. No abnormal soft tissue calcification is identified. No acute osseous abnormality is seen.  IMPRESSION: 1. Mildly increased interstitial lung markings bilaterally, nonspecific but can be seen in the setting of viral/atypical infection. 2. Nonobstructed bowel-gas pattern.   Electronically Signed   By: Sebastian AcheAllen  Grady   On: 06/11/2013 09:13    EKG Interpretation   None       MDM   1. Nausea and vomiting    Patient with nausea and vomiting. Previous visits for the same. Labs and x-rays from that time reviewed. Patient's tolerate orals here. He has Phenergan and Zofran home. Will discharge    Juliet Rudeathan R. Rubin PayorPickering, MD 06/12/13 1244

## 2013-06-12 NOTE — ED Notes (Signed)
Bed: [id number] Expected date:  Expected time:  Means of arrival:  Comments: Ems, male, n/v seen in ED for same yesterday

## 2013-06-12 NOTE — ED Notes (Signed)
Pt given ginger ale to encourage fluid challenge.

## 2013-06-12 NOTE — ED Notes (Signed)
Per EMS[facility] n/[facility]. Pt reports no relief with prescriptions.

## 2013-06-12 NOTE — ED Notes (Addendum)
Pt tolerated fluid challenge well. No n/v. EDP, Pickering made aware.

## 2013-06-12 NOTE — ED Notes (Addendum)
Pt reports taking antibiotics and nausea medication as prescribed. Pt reports felt better yesterday and woke up with n/v this morning with no relief from nausea medication.

## 2013-06-12 NOTE — Discharge Instructions (Signed)

## 2013-06-12 NOTE — ED Provider Notes (Signed)
Medical screening examination/treatment/procedure(s) were performed by non-physician practitioner and as supervising physician I was immediately available for consultation/collaboration.  EKG Interpretation   None        Juliet RudeNathan R. Rubin PayorPickering, MD 06/12/13 [id number]

## 2013-06-14 ENCOUNTER — Encounter (HOSPITAL_COMMUNITY): Payer: Self-pay | Admitting: Emergency Medicine

## 2013-06-14 ENCOUNTER — Inpatient Hospital Stay (HOSPITAL_COMMUNITY)
Admission: EM | Admit: 2013-06-14 | Discharge: 2013-06-17 | DRG: 072 | Disposition: A | Discharge status: Home / Self Care | Payer: BC Managed Care – PPO | Attending: Internal Medicine | Admitting: Internal Medicine

## 2013-06-14 ENCOUNTER — Emergency Department (HOSPITAL_COMMUNITY): Payer: BC Managed Care – PPO

## 2013-06-14 DIAGNOSIS — F32A Depression, unspecified: Secondary | ICD-10-CM | POA: Diagnosis present

## 2013-06-14 DIAGNOSIS — F17200 Nicotine dependence, unspecified, uncomplicated: Secondary | ICD-10-CM | POA: Diagnosis present

## 2013-06-14 DIAGNOSIS — R4182 Altered mental status, unspecified: Secondary | ICD-10-CM | POA: Diagnosis present

## 2013-06-14 DIAGNOSIS — G9340 Encephalopathy, unspecified: Principal | ICD-10-CM | POA: Diagnosis present

## 2013-06-14 DIAGNOSIS — F329 Major depressive disorder, single episode, unspecified: Secondary | ICD-10-CM | POA: Diagnosis present

## 2013-06-14 DIAGNOSIS — F23 Brief psychotic disorder: Secondary | ICD-10-CM

## 2013-06-14 DIAGNOSIS — E291 Testicular hypofunction: Secondary | ICD-10-CM

## 2013-06-14 DIAGNOSIS — Z23 Encounter for immunization: Secondary | ICD-10-CM

## 2013-06-14 DIAGNOSIS — G0491 Myelitis, unspecified: Secondary | ICD-10-CM

## 2013-06-14 DIAGNOSIS — F3289 Other specified depressive episodes: Secondary | ICD-10-CM

## 2013-06-14 DIAGNOSIS — Z79899 Other long term (current) drug therapy: Secondary | ICD-10-CM

## 2013-06-14 DIAGNOSIS — G0490 Encephalitis and encephalomyelitis, unspecified: Secondary | ICD-10-CM

## 2013-06-14 DIAGNOSIS — F29 Unspecified psychosis not due to a substance or known physiological condition: Secondary | ICD-10-CM | POA: Diagnosis present

## 2013-06-14 DIAGNOSIS — H5316 Psychophysical visual disturbances: Secondary | ICD-10-CM | POA: Diagnosis present

## 2013-06-14 DIAGNOSIS — K219 Gastro-esophageal reflux disease without esophagitis: Secondary | ICD-10-CM | POA: Diagnosis present

## 2013-06-14 DIAGNOSIS — N529 Male erectile dysfunction, unspecified: Secondary | ICD-10-CM

## 2013-06-14 DIAGNOSIS — F22 Delusional disorders: Secondary | ICD-10-CM | POA: Diagnosis present

## 2013-06-14 HISTORY — DX: Depression, unspecified: F32.A

## 2013-06-14 HISTORY — DX: Major depressive disorder, single episode, unspecified: F32.9

## 2013-06-14 LAB — COMPREHENSIVE METABOLIC PANEL
ALT: 29 U/L (ref 0–53)
ALT: 30 U/L (ref 0–53)
AST: 23 U/L (ref 0–37)
AST: 22 U/L (ref 0–37)
Albumin: 4.7 g/dL (ref 3.5–5.2)
Albumin: 4.4 g/dL (ref 3.5–5.2)
Alkaline Phosphatase: 48 U/L (ref 39–117)
Alkaline Phosphatase: 49 U/L (ref 39–117)
BUN: 15 mg/dL (ref 6–23)
BUN: 16 mg/dL (ref 6–23)
CO2: 24 meq/L (ref 19–32)
CO2: 23 mEq/L (ref 19–32)
CREATININE: 1.15 mg/dL (ref 0.50–1.35)
Calcium: 10 mg/dL (ref 8.4–10.5)
Calcium: 9.2 mg/dL (ref 8.4–10.5)
Chloride: 102 mEq/L (ref 96–112)
Chloride: 102 mEq/L (ref 96–112)
Creatinine, Ser: 1.21 mg/dL (ref 0.50–1.35)
GFR calc non Af Amer: 83 mL/min — ABNORMAL LOW (ref >90)
GFR, EST NON AFRICAN AMERICAN: 78 mL/min — AB (ref >90)
GLUCOSE: 104 mg/dL — AB (ref 70–99)
Glucose, Bld: 102 mg/dL — ABNORMAL HIGH (ref 70–99)
POTASSIUM: 4.1 meq/L (ref 3.7–5.3)
Potassium: 3.8 mEq/L (ref 3.7–5.3)
Sodium: 141 mEq/L (ref 137–147)
Sodium: 142 mEq/L (ref 137–147)
TOTAL PROTEIN: 7.2 g/dL (ref 6.0–8.3)
Total Bilirubin: 0.7 mg/dL (ref 0.3–1.2)
Total Bilirubin: 0.5 mg/dL (ref 0.3–1.2)
Total Protein: 7.6 g/dL (ref 6.0–8.3)

## 2013-06-14 LAB — CBC WITH DIFFERENTIAL/PLATELET
Basophils Absolute: 0 10*3/uL (ref 0.0–0.1)
Basophils Absolute: 0 10*3/uL (ref 0.0–0.1)
Basophils Relative: 0 % (ref 0–1)
Basophils Relative: 0 % (ref 0–1)
EOS ABS: 0.1 10*3/uL (ref 0.0–0.7)
Eosinophils Absolute: 0.1 10*3/uL (ref 0.0–0.7)
Eosinophils Relative: 1 % (ref 0–5)
Eosinophils Relative: 1 % (ref 0–5)
HCT: 48.1 % (ref 39.0–52.0)
HEMATOCRIT: 46.6 % (ref 39.0–52.0)
HEMOGLOBIN: 16.5 g/dL (ref 13.0–17.0)
Hemoglobin: 17 g/dL (ref 13.0–17.0)
LYMPHS ABS: 2.6 10*3/uL (ref 0.7–4.0)
LYMPHS PCT: 23 % (ref 12–46)
Lymphocytes Relative: 35 % (ref 12–46)
Lymphs Abs: 3.9 10*3/uL (ref 0.7–4.0)
MCH: 30.7 pg (ref 26.0–34.0)
MCH: 30.7 pg (ref 26.0–34.0)
MCHC: 35.3 g/dL (ref 30.0–36.0)
MCHC: 35.4 g/dL (ref 30.0–36.0)
MCV: 87 fL (ref 78.0–100.0)
MCV: 86.8 fL (ref 78.0–100.0)
MONO ABS: 1 10*3/uL (ref 0.1–1.0)
MONOS PCT: 9 % (ref 3–12)
Monocytes Absolute: 1 10*3/uL (ref 0.1–1.0)
Monocytes Relative: 8 % (ref 3–12)
NEUTROS PCT: 68 % (ref 43–77)
NEUTROS PCT: 55 % (ref 43–77)
Neutro Abs: 7.9 10*3/uL — ABNORMAL HIGH (ref 1.7–7.7)
Neutro Abs: 6.2 10*3/uL (ref 1.7–7.7)
PLATELETS: 255 10*3/uL (ref 150–400)
Platelets: 281 10*3/uL (ref 150–400)
RBC: 5.53 MIL/uL (ref 4.22–5.81)
RBC: 5.37 MIL/uL (ref 4.22–5.81)
RDW: 15.2 % (ref 11.5–15.5)
RDW: 15.5 % (ref 11.5–15.5)
WBC: 11.6 10*3/uL — ABNORMAL HIGH (ref 4.0–10.5)
WBC: 11.3 10*3/uL — ABNORMAL HIGH (ref 4.0–10.5)

## 2013-06-14 LAB — URINALYSIS, ROUTINE W REFLEX MICROSCOPIC
GLUCOSE, UA: NEGATIVE mg/dL
Hgb urine dipstick: NEGATIVE
Ketones, ur: NEGATIVE mg/dL
LEUKOCYTES UA: NEGATIVE
Nitrite: NEGATIVE
PROTEIN: 30 mg/dL — AB
Specific Gravity, Urine: 1.022 (ref 1.005–1.030)
Urobilinogen, UA: 1 mg/dL (ref 0.0–1.0)
pH: 6 (ref 5.0–8.0)

## 2013-06-14 LAB — RAPID URINE DRUG SCREEN, HOSP PERFORMED
AMPHETAMINES: NOT DETECTED
Barbiturates: NOT DETECTED
Benzodiazepines: NOT DETECTED
Cocaine: NOT DETECTED
Opiates: NOT DETECTED
Tetrahydrocannabinol: NOT DETECTED

## 2013-06-14 LAB — GRAM STAIN: Gram Stain: NONE SEEN

## 2013-06-14 LAB — URINE MICROSCOPIC-ADD ON: Urine-Other: NONE SEEN

## 2013-06-14 LAB — CSF CELL COUNT WITH DIFFERENTIAL
RBC COUNT CSF: 7950 /mm3 — AB
RBC Count, CSF: 60000 /mm3 — ABNORMAL HIGH
SUPERNATANT: UNDETERMINED
TUBE #: 1
Tube #: 4
WBC, CSF: 4 /mm3 (ref 0–5)
WBC, CSF: 6 /mm3 — ABNORMAL HIGH (ref 0–5)

## 2013-06-14 LAB — SALICYLATE LEVEL: Salicylate Lvl: <2 mg/dL — ABNORMAL LOW (ref 2.8–20.0)

## 2013-06-14 LAB — CK: CK TOTAL: 124 U/L (ref 7–232)

## 2013-06-14 LAB — PROTEIN, CSF: TOTAL PROTEIN, CSF: 98 mg/dL — AB (ref 15–45)

## 2013-06-14 LAB — CG4 I-STAT (LACTIC ACID): Lactic Acid, Venous: 1.06 mmol/L (ref 0.5–2.2)

## 2013-06-14 LAB — ETHANOL

## 2013-06-14 LAB — GLUCOSE, CSF: Glucose, CSF: 75 mg/dL (ref 43–76)

## 2013-06-14 LAB — ACETAMINOPHEN LEVEL: Acetaminophen (Tylenol), Serum: <15 ug/mL (ref 10–30)

## 2013-06-14 LAB — AMMONIA: AMMONIA: 37 umol/L (ref 11–60)

## 2013-06-14 MED ORDER — VANCOMYCIN HCL 10 G IV SOLR
1250.0000 mg | Freq: Once | INTRAVENOUS | Status: AC
  Administered 2013-06-15: 1250 mg via INTRAVENOUS
  Filled 2013-06-14: qty 1250

## 2013-06-14 MED ORDER — SODIUM CHLORIDE 0.9 % IJ SOLN
3.0000 mL | Freq: Two times a day (BID) | INTRAMUSCULAR | Status: DC
  Administered 2013-06-15: 16:00:00 via INTRAVENOUS

## 2013-06-14 MED ORDER — SODIUM CHLORIDE 0.9 % IV SOLN
INTRAVENOUS | Status: DC
  Administered 2013-06-16 (×2): via INTRAVENOUS

## 2013-06-14 MED ORDER — ZOLPIDEM TARTRATE 5 MG PO TABS: 5.0000 mg | ORAL_TABLET | Freq: Every evening | ORAL | Status: DC | PRN

## 2013-06-14 MED ORDER — LORAZEPAM 1 MG PO TABS
1.0000 mg | ORAL_TABLET | Freq: Once | ORAL | Status: AC
  Administered 2013-06-14: 1 mg via ORAL
  Filled 2013-06-14: qty 1

## 2013-06-14 MED ORDER — IBUPROFEN 200 MG PO TABS: 600.0000 mg | ORAL_TABLET | Freq: Three times a day (TID) | ORAL | Status: DC | PRN

## 2013-06-14 MED ORDER — ONDANSETRON HCL 4 MG PO TABS: 4.0000 mg | ORAL_TABLET | Freq: Four times a day (QID) | ORAL | Status: DC | PRN

## 2013-06-14 MED ORDER — DEXTROSE 5 % IV SOLN
700.0000 mg | Freq: Three times a day (TID) | INTRAVENOUS | Status: DC
  Administered 2013-06-14 – 2013-06-16 (×5): 700 mg via INTRAVENOUS
  Filled 2013-06-14 (×6): qty 14

## 2013-06-14 MED ORDER — ONDANSETRON HCL 4 MG PO TABS: 4.0000 mg | ORAL_TABLET | Freq: Three times a day (TID) | ORAL | Status: DC | PRN

## 2013-06-14 MED ORDER — LORAZEPAM 1 MG PO TABS
2.0000 mg | ORAL_TABLET | Freq: Once | ORAL | Status: AC
  Administered 2013-06-14: 2 mg via ORAL
  Filled 2013-06-14: qty 2

## 2013-06-14 MED ORDER — SODIUM CHLORIDE 0.9 % IV SOLN
INTRAVENOUS | Status: AC
  Administered 2013-06-14: 23:00:00 via INTRAVENOUS

## 2013-06-14 MED ORDER — LORAZEPAM 1 MG PO TABS
1.0000 mg | ORAL_TABLET | Freq: Three times a day (TID) | ORAL | Status: DC | PRN
  Administered 2013-06-15 – 2013-06-16 (×3): 1 mg via ORAL
  Filled 2013-06-14 (×3): qty 1

## 2013-06-14 MED ORDER — NICOTINE 21 MG/24HR TD PT24
21.0000 mg | MEDICATED_PATCH | Freq: Every day | TRANSDERMAL | Status: DC
  Administered 2013-06-15 – 2013-06-16 (×2): 21 mg via TRANSDERMAL
  Filled 2013-06-14 (×3): qty 1

## 2013-06-14 MED ORDER — ONDANSETRON HCL 4 MG/2ML IJ SOLN: 4.0000 mg | Freq: Four times a day (QID) | INTRAMUSCULAR | Status: DC | PRN

## 2013-06-14 MED ORDER — SODIUM CHLORIDE 0.9 % IV BOLUS (SEPSIS)
1000.0000 mL | Freq: Once | INTRAVENOUS | Status: AC
  Administered 2013-06-14: 1000 mL via INTRAVENOUS

## 2013-06-14 MED ORDER — DEXTROSE 5 % IV SOLN
2.0000 g | Freq: Once | INTRAVENOUS | Status: AC
  Administered 2013-06-14: 2 g via INTRAVENOUS
  Filled 2013-06-14: qty 2

## 2013-06-14 MED ORDER — ALUM & MAG HYDROXIDE-SIMETH 200-200-20 MG/5ML PO SUSP: 30.0000 mL | ORAL | Status: DC | PRN

## 2013-06-14 MED ORDER — ACETAMINOPHEN 325 MG PO TABS: 650.0000 mg | ORAL_TABLET | ORAL | Status: DC | PRN

## 2013-06-14 MED ORDER — ENOXAPARIN SODIUM 40 MG/0.4ML ~~LOC~~ SOLN
40.0000 mg | SUBCUTANEOUS | Status: DC
  Administered 2013-06-15 – 2013-06-17 (×3): 40 mg via SUBCUTANEOUS
  Filled 2013-06-14 (×3): qty 0.4

## 2013-06-14 MED ORDER — ACETAMINOPHEN 325 MG PO TABS
650.0000 mg | ORAL_TABLET | Freq: Once | ORAL | Status: AC
  Administered 2013-06-14: 650 mg via ORAL
  Filled 2013-06-14: qty 2
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
## 2013-06-14 NOTE — ED Notes (Signed)
Report to susan rn, belongings behind n. station
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
## 2013-06-14 NOTE — ED Notes (Signed)
Pt transferred to room 22 for further eval.

## 2013-06-14 NOTE — ED Notes (Signed)
Patient transported to CT 
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
## 2013-06-14 NOTE — ED Notes (Signed)
Dr goldstein into see 
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
## 2013-06-14 NOTE — ED Provider Notes (Signed)
Medical screening examination/treatment/procedure(s) were performed by non-physician practitioner and as supervising physician I was immediately available for consultation/collaboration.  EKG Interpretation    Date/Time:  Sunday June 14 2013 06:14:20 EST Ventricular Rate:  108 PR Interval:  149 QRS Duration: 85 QT Interval:  326 QTC Calculation: 437 R Axis:   89 Text Interpretation:  Sinus tachycardia Borderline T wave abnormalities No significant change since last tracing Confirmed by Canaan Holzer  MD-J, Xena Propst ([id number]) on 06/14/2013 8:56:42 AM              Celene KrasJon R Shonta Bourque, MD 06/14/13 [phone]
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
## 2013-06-14 NOTE — ED Notes (Signed)
Up to the bathroom 

## 2013-06-14 NOTE — ED Notes (Signed)
Dr Gwenlyn Fudgegoldstein into see
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
## 2013-06-14 NOTE — ED Notes (Signed)
Still confused, remains tremulous, skin dry, but warm to touch, rapid eye movement

## 2013-06-14 NOTE — ED Notes (Signed)
Pt does not eat pork products.
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
## 2013-06-14 NOTE — ED Notes (Signed)
Bed: ZO10WA22 Expected date:  Expected time:  Means of arrival:  Comments: Hold
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
## 2013-06-14 NOTE — ED Notes (Signed)
To goldstein into see
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
## 2013-06-15 ENCOUNTER — Inpatient Hospital Stay (HOSPITAL_COMMUNITY)
Admit: 2013-06-15 | Discharge: 2013-06-15 | Disposition: A | Discharge status: Home / Self Care | Payer: BC Managed Care – PPO | Attending: Internal Medicine | Admitting: Internal Medicine

## 2013-06-15 ENCOUNTER — Encounter (HOSPITAL_COMMUNITY): Payer: Self-pay

## 2013-06-15 ENCOUNTER — Inpatient Hospital Stay (HOSPITAL_COMMUNITY): Payer: BC Managed Care – PPO

## 2013-06-15 DIAGNOSIS — R4182 Altered mental status, unspecified: Secondary | ICD-10-CM

## 2013-06-15 LAB — URINALYSIS, ROUTINE W REFLEX MICROSCOPIC
Bilirubin Urine: NEGATIVE
Glucose, UA: NEGATIVE mg/dL
Hgb urine dipstick: NEGATIVE
Ketones, ur: NEGATIVE mg/dL
LEUKOCYTES UA: NEGATIVE
NITRITE: NEGATIVE
PH: 6 (ref 5.0–8.0)
Protein, ur: NEGATIVE mg/dL
Specific Gravity, Urine: 1.024 (ref 1.005–1.030)
Urobilinogen, UA: 0.2 mg/dL (ref 0.0–1.0)

## 2013-06-15 LAB — INFLUENZA PANEL BY PCR (TYPE A & B)
H1N1 flu by pcr: NOT DETECTED
Influenza A By PCR: NEGATIVE
Influenza B By PCR: NEGATIVE

## 2013-06-15 LAB — COMPREHENSIVE METABOLIC PANEL
ALT: 26 U/L (ref 0–53)
AST: 19 U/L (ref 0–37)
Albumin: 3.9 g/dL (ref 3.5–5.2)
Alkaline Phosphatase: 39 U/L (ref 39–117)
BILIRUBIN TOTAL: 0.7 mg/dL (ref 0.3–1.2)
BUN: 13 mg/dL (ref 6–23)
CALCIUM: 8.2 mg/dL — AB (ref 8.4–10.5)
CHLORIDE: 106 meq/L (ref 96–112)
CO2: 23 mEq/L (ref 19–32)
CREATININE: 1.04 mg/dL (ref 0.50–1.35)
GFR calc non Af Amer: >90 mL/min (ref >90)
GLUCOSE: 101 mg/dL — AB (ref 70–99)
Potassium: 4 mEq/L (ref 3.7–5.3)
Sodium: 143 mEq/L (ref 137–147)
Total Protein: 6.5 g/dL (ref 6.0–8.3)

## 2013-06-15 LAB — CBC
HCT: 43.8 % (ref 39.0–52.0)
HEMOGLOBIN: 14.9 g/dL (ref 13.0–17.0)
MCH: 30 pg (ref 26.0–34.0)
MCHC: 34 g/dL (ref 30.0–36.0)
MCV: 88.3 fL (ref 78.0–100.0)
Platelets: 221 10*3/uL (ref 150–400)
RBC: 4.96 MIL/uL (ref 4.22–5.81)
RDW: 15.5 % (ref 11.5–15.5)
WBC: 10.8 10*3/uL — ABNORMAL HIGH (ref 4.0–10.5)

## 2013-06-15 LAB — PROTIME-INR
INR: 1.07 (ref 0.00–1.49)
Prothrombin Time: 13.7 seconds (ref 11.6–15.2)

## 2013-06-15 LAB — HERPES SIMPLEX VIRUS(HSV) DNA BY PCR
HSV 1 DNA: NOT DETECTED
HSV 2 DNA: NOT DETECTED

## 2013-06-15 MED ORDER — ALUM & MAG HYDROXIDE-SIMETH 200-200-20 MG/5ML PO SUSP
15.0000 mL | ORAL | Status: DC | PRN
  Administered 2013-06-16 (×2): 15 mL via ORAL
  Filled 2013-06-15 (×2): qty 30

## 2013-06-15 MED ORDER — DEXTROSE 5 % IV SOLN
2.0000 g | Freq: Two times a day (BID) | INTRAVENOUS | Status: DC
  Administered 2013-06-15 (×2): 2 g via INTRAVENOUS
  Filled 2013-06-15 (×3): qty 2

## 2013-06-15 MED ORDER — PNEUMOCOCCAL VAC POLYVALENT 25 MCG/0.5ML IJ INJ
0.5000 mL | INJECTION | INTRAMUSCULAR | Status: AC
  Administered 2013-06-16: 0.5 mL via INTRAMUSCULAR
  Filled 2013-06-15 (×2): qty 0.5

## 2013-06-15 MED ORDER — IBUPROFEN 800 MG PO TABS
400.0000 mg | ORAL_TABLET | Freq: Four times a day (QID) | ORAL | Status: DC | PRN
  Administered 2013-06-15 (×2): 400 mg via ORAL
  Filled 2013-06-15 (×2): qty 1

## 2013-06-15 MED ORDER — HALOPERIDOL LACTATE 5 MG/ML IJ SOLN
1.0000 mg | Freq: Four times a day (QID) | INTRAMUSCULAR | Status: DC | PRN
  Administered 2013-06-15 (×2): 2 mg via INTRAVENOUS
  Filled 2013-06-15 (×3): qty 1

## 2013-06-15 MED ORDER — INFLUENZA VAC SPLIT QUAD 0.5 ML IM SUSP
0.5000 mL | INTRAMUSCULAR | Status: AC
  Administered 2013-06-16: 0.5 mL via INTRAMUSCULAR
  Filled 2013-06-15 (×2): qty 0.5

## 2013-06-15 MED ORDER — GADOBENATE DIMEGLUMINE 529 MG/ML IV SOLN
17.0000 mL | Freq: Once | INTRAVENOUS | Status: AC | PRN
  Administered 2013-06-15: 17 mL via INTRAVENOUS

## 2013-06-15 MED ORDER — LORAZEPAM 2 MG/ML IJ SOLN
2.0000 mg | Freq: Once | INTRAMUSCULAR | Status: AC
  Administered 2013-06-15: 2 mg via INTRAVENOUS

## 2013-06-15 MED ORDER — LORAZEPAM 2 MG/ML IJ SOLN
INTRAMUSCULAR | Status: AC
  Filled 2013-06-15: qty 1

## 2013-06-15 MED ORDER — LORAZEPAM 2 MG/ML IJ SOLN: 2.0000 mg | Freq: Once | INTRAMUSCULAR | Status: DC

## 2013-06-15 MED ORDER — VANCOMYCIN HCL IN DEXTROSE 1-5 GM/200ML-% IV SOLN
1000.0000 mg | Freq: Three times a day (TID) | INTRAVENOUS | Status: DC
  Administered 2013-06-15 – 2013-06-16 (×3): 1000 mg via INTRAVENOUS
  Filled 2013-06-15 (×5): qty 200

## 2013-06-15 NOTE — Progress Notes (Addendum)
Pt gave verbal permission to his co worker Clotilde DieterHRIVKRAMA G Jadj to get his possessions locked in security and remove his cell phone and give it to the pt. The rest of his possessions listed on the envelope were returned to security and the key is in his chart . Pt appears to be having anxiety and hallucinations still. Wanting to standup then lay back down. States he can't feel his legs, then states he just feels them shaking. Pt has sitter with him at all times. His  Co workers came in to visit pt for a short time. A couple of them stated they had never seen him this way. Pt is from [place]. He speaks [place] and [place] , states he's been in the [place] for 10 years and has been separated from his wife for a year.
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
## 2013-06-15 NOTE — Progress Notes (Signed)
Pt keeps his eyes closed when he talks to anyone. He can open them when asked. Pt states he can't breath, O2 sat 96%. Pt refused to have 02 on. Calmed down when I sat and held his hands- he fell asleep.
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
## 2013-06-15 NOTE — Procedures (Signed)
ELECTROENCEPHALOGRAM REPORT   Patient: Paul Coleman       Room #: [facility] 1507 EEG No. ID: [id number] Age: 33 y.o.        Sex: male Referring Physician: Jomarie LongsJoseph Report Date:  06/15/2013        Interpreting Physician: Thana FarrEYNOLDS,Bonni Neuser D  History: Paul Coleman is an 33 y.o. male presenting with altered mental status  Medications:  Scheduled: . acyclovir  700 mg Intravenous Q8H  . cefTRIAXone (ROCEPHIN)  IV  2 g Intravenous Q12H  . enoxaparin (LOVENOX) injection  40 mg Subcutaneous Q24H  . [START ON 06/16/2013] influenza vac split quadrivalent PF  0.5 mL Intramuscular Tomorrow-1000  . LORazepam      . nicotine  21 mg Transdermal Daily  . [START ON 06/16/2013] pneumococcal 23 valent vaccine  0.5 mL Intramuscular Tomorrow-1000  . sodium chloride  3 mL Intravenous Q12H  . vancomycin  1,000 mg Intravenous Q8H    Conditions of Recording:  This is a 16 channel EEG carried out with the patient in the asleep state.  Description:   The patient is asleep throughout the majority of the tracing.  Normal stage II sleep is noted with symmetrical sleep spindles, vertex central sharp transients and irregular slow activity.   The patient is alerted in an attempt to achieve wakefulness.  Although the sleep activity resolves there is a predominance of low voltage artifact at this time and no background activity can be evaluated.   Hyperventilation and intermittent photic stimulation were not performed.  IMPRESSION: This is a normal asleep electroencephalogram   Thana FarrLeslie Rivkah Wolz, MD [facility] [phone] 06/15/2013, 5:29 PM

## 2013-06-15 NOTE — Progress Notes (Signed)
Pt returned from MRI sleeping.

## 2013-06-15 NOTE — Progress Notes (Signed)
Offsite EEG completed.
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
## 2013-06-15 NOTE — Progress Notes (Signed)
ANTIBIOTIC CONSULT NOTE - INITIAL  Pharmacy Consult for Acyclovir, ceftriaxone, vancomycin Indication: encephalitis, high in differential his herpes encephalitis   No Known Allergies  Patient Measurements: Height: 5\' 8"  (172.7 cm) Weight: 180 lb (81.647 kg) IBW/kg (Calculated) : 68.4 Adjusted Body Weight:   Vital Signs: Temp: 98.9 F (37.2 C) (01/18 1943) Temp src: Oral (01/18 1943) BP: 127/78 mmHg (01/18 2130) Pulse Rate: 66 (01/18 2030) Intake/Output from previous day:   Intake/Output from this shift:    Labs:  Recent Labs  06/14/13 0732 06/14/13 1834  WBC 11.6* 11.3*  HGB 17.0 16.5  PLT 255 281  CREATININE 1.21 1.15   Estimated Creatinine Clearance: 89.2 ml/min (by C-G formula based on Cr of 1.15). No results found for this basename: VANCOTROUGH, VANCOPEAK, VANCORANDOM, GENTTROUGH, GENTPEAK, GENTRANDOM, TOBRATROUGH, TOBRAPEAK, TOBRARND, AMIKACINPEAK, AMIKACINTROU, AMIKACIN,  in the last 72 hours   Microbiology: Recent Results (from the past 720 hour(s))  GRAM STAIN     Status: None   Collection Time    06/14/13  7:13 PM      Result Value Range Status   Specimen Description CSF   Final   Special Requests NONE   Final   Gram Stain     Final   Value: NO WBC SEEN     NO ORGANISMS SEEN     Gram Stain Report Called to,Read Back By and Verified With: TOLEDO,T AT 2145 ON [id number] BY HOOKER,B   Report Status 06/14/2013 FINAL   Final    Medical History: Past Medical History  Diagnosis Date  . GERD (gastroesophageal reflux disease)   . Depression     Medications:  Anti-infectives   Start     Dose/Rate Route Frequency Ordered Stop   06/14/13 2230  cefTRIAXone (ROCEPHIN) 2 g in dextrose 5 % 50 mL IVPB     2 g 100 mL/hr over 30 Minutes Intravenous  Once 06/14/13 2219 06/14/13 2329   06/14/13 2230  vancomycin (VANCOCIN) 1,250 mg in sodium chloride 0.9 % 250 mL IVPB     1,250 mg 166.7 mL/hr over 90 Minutes Intravenous  Once 06/14/13 2219 06/15/13 0141   06/14/13  2230  acyclovir (ZOVIRAX) 700 mg in dextrose 5 % 100 mL IVPB     700 mg 114 mL/hr over 60 Minutes Intravenous 3 times per day 06/14/13 2224       Assessment: Patient with encephalitis, high in differential his herpes encephalitis.  First dose of antibiotics already given.    Goal of Therapy:  Vancomycin trough level 15-20 mcg/ml Ceftriaxone, acyclovir for encephalitis dosing  Plan:  Measure antibiotic drug levels at steady state Follow up culture results Vancomycin 1gm iv q8hr, acyclovir 700mg  iv q8hr, ceftriaxone 2gm iv q12hr  Paul DavidsonGrimsley Coleman, Paul Coleman 06/15/2013,2:27 AM

## 2013-06-16 LAB — CBC
HCT: 42.5 % (ref 39.0–52.0)
HEMOGLOBIN: 14.8 g/dL (ref 13.0–17.0)
MCH: 30.1 pg (ref 26.0–34.0)
MCHC: 34.8 g/dL (ref 30.0–36.0)
MCV: 86.6 fL (ref 78.0–100.0)
PLATELETS: 236 10*3/uL (ref 150–400)
RBC: 4.91 MIL/uL (ref 4.22–5.81)
RDW: 15 % (ref 11.5–15.5)
WBC: 12.5 10*3/uL — ABNORMAL HIGH (ref 4.0–10.5)

## 2013-06-16 LAB — COMPREHENSIVE METABOLIC PANEL
ALT: 23 U/L (ref 0–53)
AST: 26 U/L (ref 0–37)
Albumin: 4 g/dL (ref 3.5–5.2)
Alkaline Phosphatase: 41 U/L (ref 39–117)
BUN: 12 mg/dL (ref 6–23)
CALCIUM: 8.6 mg/dL (ref 8.4–10.5)
CO2: 19 meq/L (ref 19–32)
CREATININE: 0.83 mg/dL (ref 0.50–1.35)
Chloride: 101 mEq/L (ref 96–112)
GFR calc Af Amer: >90 mL/min (ref >90)
Glucose, Bld: 88 mg/dL (ref 70–99)
Potassium: 3.9 mEq/L (ref 3.7–5.3)
SODIUM: 137 meq/L (ref 137–147)
Total Bilirubin: 0.7 mg/dL (ref 0.3–1.2)
Total Protein: 6.7 g/dL (ref 6.0–8.3)

## 2013-06-16 LAB — URINE CULTURE
CULTURE: NO GROWTH
Colony Count: NO GROWTH

## 2013-06-16 MED ORDER — PANTOPRAZOLE SODIUM 40 MG PO TBEC
40.0000 mg | DELAYED_RELEASE_TABLET | Freq: Every day | ORAL | Status: DC
  Administered 2013-06-16: 40 mg via ORAL
  Filled 2013-06-16 (×2): qty 1

## 2013-06-16 MED ORDER — RISPERIDONE 0.5 MG PO TABS
0.5000 mg | ORAL_TABLET | Freq: Every day | ORAL | Status: DC
  Administered 2013-06-16: 0.5 mg via ORAL
  Filled 2013-06-16 (×2): qty 1

## 2013-06-16 NOTE — Progress Notes (Signed)
Subjective: Patient remains slightly agitated.  No new complaints.  Is able to get out of bed and transfer independently.    Objective: Current vital signs: BP 147/77  Pulse 98  Temp(Src) 97.9 F (36.6 C) (Oral)  Resp 18  Ht 5\' 8"  (1.727 m)  Wt 81.647 kg (180 lb)  BMI 27.38 kg/m2  SpO2 96% Vital signs in last 24 hours: Temp:  [97.9 F (36.6 C)-99.9 F (37.7 C)] 97.9 F (36.6 C) (01/20 0627) Pulse Rate:  [94-129] 98 (01/20 0627) Resp:  [18-36] 18 (01/20 0627) BP: (128-168)/(77-100) 147/77 mmHg (01/20 0627) SpO2:  [94 %-100 %] 96 % (01/20 0627)  Intake/Output from previous day: 01/19 0701 - 01/20 0700 In: 1308.66638.3 [P.O.:2037; I.V.:3809.3; IV Piggyback:792] Out: 1775 [Urine:1775] Intake/Output this shift:   Nutritional status: General  Neurologic Exam: Mental Status:  Anxious. Speech fluent without evidence of aphasia. Able to follow 3 step commands without difficulty.  Cranial Nerves:  II: Discs flat bilaterally; Visual fields grossly normal, pupils equal, round, reactive to light and accommodation  III,IV, VI: ptosis not present, extra-ocular motions intact bilaterally  V,VII: smile symmetric, facial light touch sensation normal bilaterally  VIII: hearing normal bilaterally  IX,X: gag reflex present  XI: bilateral shoulder shrug  XII: midline tongue extension without atrophy or fasciculations  Motor:  Moves all extremities symmetrically Deep Tendon Reflexes:  1 throughout  Lab Results: Basic Metabolic Panel:  Recent Labs Lab 06/11/13 0850 06/14/13 0732 06/14/13 1834 06/15/13 0530 06/16/13 0530  NA 142 141 142 143 137  K 3.9 4.1 3.8 4.0 3.9  CL 102 102 102 106 101  CO2 27 24 23 23 19   GLUCOSE 103* 104* 102* 101* 88  BUN 7 15 16 13 12   CREATININE 0.95 1.21 1.15 1.04 0.83  CALCIUM 9.0 10.0 9.2 8.2* 8.6    Liver Function Tests:  Recent Labs Lab 06/11/13 0850 06/14/13 0732 06/14/13 1834 06/15/13 0530 06/16/13 0530  AST 16 23 22 19 26   ALT 19 29 30  26 23   ALKPHOS 44 48 49 39 41  BILITOT 0.4 0.7 0.5 0.7 0.7  PROT 7.2 7.6 7.2 6.5 6.7  ALBUMIN 4.2 4.7 4.4 3.9 4.0    Recent Labs Lab 06/11/13 0850  LIPASE 23    Recent Labs Lab 06/14/13 0732  AMMONIA 37    CBC:  Recent Labs Lab 06/09/13 2100 06/11/13 0850 06/14/13 0732 06/14/13 1834 06/15/13 0530 06/16/13 0530  WBC 9.2 8.3 11.6* 11.3* 10.8* 12.5*  NEUTROABS 4.7 5.1 7.9* 6.2  --   --   HGB 16.5 16.3 17.0 16.5 14.9 14.8  HCT 46.3 46.8 48.1 46.6 43.8 42.5  MCV 86.2 88.3 87.0 86.8 88.3 86.6  PLT 259 224 255 281 221 236    Cardiac Enzymes:  Recent Labs Lab 06/14/13 1834  CKTOTAL 124    Lipid Panel: No results found for this basename: CHOL, TRIG, HDL, CHOLHDL, VLDL, LDLCALC,  in the last 168 hours  CBG:  Recent Labs Lab 06/09/13 2153  GLUCAP 97    Microbiology: Results for orders placed during the hospital encounter of 06/14/13  CSF CULTURE     Status: None   Collection Time    06/14/13  7:13 PM      Result Value Range Status   Specimen Description BACK   Final   Special Requests NONE   Final   Gram Stain     Final   Value: NO WBC SEEN     NO ORGANISMS SEEN  Performed by [facility]     Performed at [facility]   Culture PENDING   Incomplete   Report Status PENDING   Incomplete  GRAM STAIN     Status: None   Collection Time    06/14/13  7:13 PM      Result Value Range Status   Specimen Description CSF   Final   Special Requests NONE   Final   Gram Stain     Final   Value: NO WBC SEEN     NO ORGANISMS SEEN     Gram Stain Report Called to,Read Back By and Verified With: TOLEDO,T AT 2145 ON [id number] BY HOOKER,B   Report Status 06/14/2013 FINAL   Final  CULTURE, BLOOD (ROUTINE X 2)     Status: None   Collection Time    06/14/13 11:10 PM      Result Value Range Status   Specimen Description BLOOD LEFT ANTECUBITAL   Final   Special Requests BOTTLES DRAWN AEROBIC AND ANAEROBIC 5CC   Final   Culture  Setup Time     Final    Value: 06/15/2013 08:54     Performed at [facility]   Culture     Final   Value:        BLOOD CULTURE RECEIVED NO GROWTH TO DATE CULTURE WILL BE HELD FOR 5 DAYS BEFORE ISSUING A FINAL NEGATIVE REPORT     Performed at [facility]   Report Status PENDING   Incomplete  CULTURE, BLOOD (ROUTINE X 2)     Status: None   Collection Time    06/14/13 11:20 PM      Result Value Range Status   Specimen Description BLOOD LEFT HAND   Final   Special Requests BOTTLES DRAWN AEROBIC AND ANAEROBIC 5CC   Final   Culture  Setup Time     Final   Value: 06/15/2013 08:52     Performed at [facility]   Culture     Final   Value:        BLOOD CULTURE RECEIVED NO GROWTH TO DATE CULTURE WILL BE HELD FOR 5 DAYS BEFORE ISSUING A FINAL NEGATIVE REPORT     Performed at [facility]   Report Status PENDING   Incomplete    Coagulation Studies:  Recent Labs  06/15/13 0530  LABPROT 13.7  INR 1.07    Imaging: Mr Laqueta Jean Wo Contrast  06/15/2013   CLINICAL DATA:  Altered mental status.  History of trauma.  EXAM: MRI HEAD WITHOUT AND WITH CONTRAST  TECHNIQUE: Multiplanar, multiecho pulse sequences of the brain and surrounding structures were obtained without and with intravenous contrast.  CONTRAST:  17mL MULTIHANCE GADOBENATE DIMEGLUMINE 529 MG/ML IV SOLN  COMPARISON:  CT head 06/14/2013.  FINDINGS: The patient was unable to remain motionless for the exam. Small or subtle lesions could be overlooked.  No evidence for acute infarction, hemorrhage, mass lesion, hydrocephalus, or extra-axial fluid. Borderline premature cerebral atrophy. No white matter disease. Flow voids are maintained throughout the carotid, basilar, and vertebral arteries. There are no areas of chronic hemorrhage. Post infusion, no abnormal enhancement of the brain or meninges.  Pituitary, pineal, and cerebellar tonsils unremarkable. No upper cervical lesions. Visualized calvarium, skull base, and upper cervical osseous  structures unremarkable. Scalp and extracranial soft tissues, orbits, sinuses, and mastoids show no acute process. Asymmetric hypertrophic change is observed in the left orbital roof (image 24 series 9 for instance).  Good  general agreement with prior CT.  IMPRESSION: Motion degraded exam. Small or subtle lesions could be overlooked.  No acute intracranial findings are evident.  Borderline premature cerebral atrophy   Electronically Signed   By: Davonna Belling M.D.   On: 06/15/2013 15:35    Medications:  I have reviewed the patient's current medications. Scheduled: . enoxaparin (LOVENOX) injection  40 mg Subcutaneous Q24H  . nicotine  21 mg Transdermal Daily  . pantoprazole  40 mg Oral Q1200  . risperiDONE  0.5 mg Oral QHS  . sodium chloride  3 mL Intravenous Q12H    Assessment/Plan: Patient admitted with mental status changes.  MRI of the brain reviewed and shows no acute changes.  LP shows increased wbc's (6), increased rbc's (6000) and increased protein at 98.  Glucose was normal.  Preliminary cultures show no growth and no organisms.  HSV remains pending.  EEG was unremarkable.    Recommendations: 1.  Discontinue antibiotics 2.  Would continue Acyclovir until HSV titer results 3.  Agree with continued involvement of psychiatry   LOS: 2 days   Thana Farr, MD [facility] [phone] 06/16/2013  9:45 AM

## 2013-06-16 NOTE — Progress Notes (Signed)
Clinical Social Work Department CLINICAL SOCIAL WORK PSYCHIATRY SERVICE LINE ASSESSMENT 06/16/2013  Patient:  Paul Coleman  Account:  [id number]  [place] Date:  06/14/2013  Clinical Social Worker:  Sindy Messing, LCSW  Date/Time:  06/16/2013 02:30 PM Referred by:  Physician  Date referred:  06/16/2013 Reason for Referral  Psychosocial assessment   Presenting Symptoms/Problems (In the person's/family's own words):   Psych consulted due to acute psychosis   Abuse/Neglect/Trauma History (check all that apply)  Denies history   Abuse/Neglect/Trauma Comments:   Psychiatric History (check all that apply)  Outpatient treatment   Psychiatric medications:  Risperdal 0.5 mg   Current Mental Health Hospitalizations/Previous Mental Health History:   Patient reports he was diagnosed with depression and anxiety. Patient receives medication management and reports he was prescribed Wellbutrin and Prozac. Patient feels that medication is effective.   Current provider:   [facility]   Place and Date:   [place], [place]   Current Medications:   Scheduled Meds:      . enoxaparin (LOVENOX) injection  40 mg Subcutaneous Q24H  . nicotine  21 mg Transdermal Daily  . pantoprazole  40 mg Oral Q1200  . risperiDONE  0.5 mg Oral QHS  . sodium chloride  3 mL Intravenous Q12H        Continuous Infusions:      . sodium chloride 75 mL/hr at 06/16/13 1413          PRN Meds:.acetaminophen, alum & mag hydroxide-simeth, ibuprofen, LORazepam, ondansetron (ZOFRAN) IV       Previous Impatient Admission/Date/Reason:   None reported   Emotional Health / Current Symptoms    Suicide/Self Harm  None reported   Suicide attempt in the past:   Patient denies any current SI or HI. Patient denies any previous suicide attempts.   Other harmful behavior:   N/A   Psychotic/Dissociative Symptoms  Visual Hallucinations  Delusional   Other Psychotic/Dissociative Symptoms:   Patient reports that for 2-3 days he was  experiencing hallucinations and felt delusional. Patient reports that he felt that someone was running around him or hitting him. Patient reports this was his first experience with any psychotic symptoms and denies any current symptoms now.    Attention/Behavioral Symptoms  Within Normal Limits   Other Attention / Behavioral Symptoms:   Patient engaged during assessment.    Cognitive Impairment  Within Normal Limits   Other Cognitive Impairment:   Patient alert and oriented.    Mood and Adjustment  Flat    Stress, Anxiety, Trauma, Any Recent Loss/Stressor  None reported   Anxiety (frequency):   N/A   Phobia (specify):   N/A   Compulsive behavior (specify):   N/A   Obsessive behavior (specify):   N/A   Other:   N/A   Substance Abuse/Use  None   SBIRT completed (please refer for detailed history):  N  Self-reported substance use:   Patient denies any substance use.   Urinary Drug Screen Completed:  Y Alcohol level:   <11    Environmental/Housing/Living Arrangement  Stable housing   Who is in the home:   Alone   Emergency contact:  Engineer, site   Patient's Strengths and Goals (patient's own words):   Patient reports that sister is supportive and that he has good friends that live nearby.   Clinical Social Worker's Interpretive Summary:   CSW received referral in order to complete psychosocial assessment. CSW reviewed chart and met with patient at bedside. CSW introduced  myself and explained role.    Patient reports that he lives by himself and works at [employer]. Patient reports supportive friends but no family lives locally. Patient has a sister that lives in [place] and is going to make arrangements to stay with him once he is DC from the hospital so that he can have some support.    CSW and patient spoke about why patient was admitted to the hospital. Patient reports that he felt that people were around  him and hitting him but in reality, he was just hallucinating. Patient reports no previous episodes and states that he is unsure why this was occurring. Patient reports that he has been diagnosed with anxiety and depression but that he is compliant with psychiatric appointments and medications. Patient states that medication is effective and he has never had any of these episodes in the past.    Patient reports no substance use and reports he is unsure why hallucinations started. Patient reports no stressors or triggers that contribute to episodes. Patient is worried about symptoms and inquired why there were occurring. Patient reports he wants to follow up with psychiatrist but is hopeful to return home with support of family.    CSW will continue to follow to assist as needed.   Disposition:  Recommend Psych CSW continuing to support while in hospital   [place], [place] [phone]

## 2013-06-16 NOTE — Consult Note (Signed)
Psychiatry followup note  Paul Coleman is an 33 y.o. male.  Who was initially seen by consultation liaison services because of psychosis and hallucination.  Patient seen today.  His confusion is improved from the past.  He is more calm and cooperative.  He denies any hallucination or any paranoia.  He endorsed before coming to the hospital he was experiencing increase paranoia and delusion and having thoughts that somebody is going to hurt him.  Patient has no previous history of psychiatric inpatient treatment or any suicidal attempt.  Patient is unmarried and he has a sister who lives in [place].  The patient works as an Engineer, technical sales.  He has given the name of his supervisor and I talked to his supervisor who mentioned that patient had some time history of irritability and anger however he was never aggressive or violent.  He is able to keep his job without any issues.  Paul Coleman is unaware that he has recently seen a psychiatrist and given Prozac and Wellbutrin.  Patient told his medication was given by Dr. Dustin Flock 3 months ago.  Patient feel that medicine helped the depression.  Patient remains very anxious and restless.  He keeps coming out from the bed and difficult to concentrate during the conversation.  He was complaining of numbness in his legs which he endorse started after the lumbar puncture.  His temperature was traumatic.  We have recommended to start Risperdal yesterday however it was not started last night.  The patient denies any active or passive suicidal thoughts but remains very restless anxious and sometimes disorganized.  Current facility-administered medications:0.9 %  sodium chloride infusion, , Intravenous, Continuous, Domenic Polite, MD, Last Rate: 75 mL/hr at 06/16/13 1413;  acetaminophen (TYLENOL) tablet 650 mg, 650 mg, Oral, Q4H PRN, Larene Pickett, PA-C;  alum & mag hydroxide-simeth (MAALOX/MYLANTA) 200-200-20 MG/5ML suspension 15 mL, 15 mL, Oral, Q4H PRN, Domenic Polite, MD, 15 mL  at 06/16/13 1406 enoxaparin (LOVENOX) injection 40 mg, 40 mg, Subcutaneous, Q24H, Berle Mull, MD, 40 mg at 06/16/13 0858;  ibuprofen (ADVIL,MOTRIN) tablet 400 mg, 400 mg, Oral, Q6H PRN, Domenic Polite, MD, 400 mg at 06/15/13 2045;  LORazepam (ATIVAN) tablet 1 mg, 1 mg, Oral, Q8H PRN, Larene Pickett, PA-C, 1 mg at 06/16/13 4709;  nicotine (NICODERM CQ - dosed in mg/24 hours) patch 21 mg, 21 mg, Transdermal, Daily, Larene Pickett, PA-C, 21 mg at 06/16/13 0903 ondansetron (ZOFRAN) injection 4 mg, 4 mg, Intravenous, Q6H PRN, Berle Mull, MD;  pantoprazole (PROTONIX) EC tablet 40 mg, 40 mg, Oral, Q1200, Domenic Polite, MD, 40 mg at 06/16/13 1406;  risperiDONE (RISPERDAL) tablet 0.5 mg, 0.5 mg, Oral, QHS, Domenic Polite, MD;  sodium chloride 0.9 % injection 3 mL, 3 mL, Intravenous, Q12H, Berle Mull, MD   Past Medical History  Diagnosis Date  . GERD (gastroesophageal reflux disease)   . Depression     History reviewed. No pertinent past surgical history.  History reviewed. No pertinent family history.  Social History:  reports that he has been smoking.  He has never used smokeless tobacco. He reports that he does not drink alcohol or use illicit drugs.  Allergies: No Known Allergies  Medications: I have reviewed the patient's current medications.  Results for orders placed during the hospital encounter of 06/14/13 (from the past 48 hour(s))  CBC WITH DIFFERENTIAL     Status: Abnormal   Collection Time    06/14/13  6:34 PM      Result Value Range  WBC 11.3 (*) 4.0 - 10.5 K/uL   RBC 5.37  4.22 - 5.81 MIL/uL   Hemoglobin 16.5  13.0 - 17.0 g/dL   HCT 46.6  39.0 - 52.0 %   MCV 86.8  78.0 - 100.0 fL   MCH 30.7  26.0 - 34.0 pg   MCHC 35.4  30.0 - 36.0 g/dL   RDW 15.5  11.5 - 15.5 %   Platelets 281  150 - 400 K/uL   Neutrophils Relative % 55  43 - 77 %   Neutro Abs 6.2  1.7 - 7.7 K/uL   Lymphocytes Relative 35  12 - 46 %   Lymphs Abs 3.9  0.7 - 4.0 K/uL   Monocytes Relative 9  3 - 12 %    Monocytes Absolute 1.0  0.1 - 1.0 K/uL   Eosinophils Relative 1  0 - 5 %   Eosinophils Absolute 0.1  0.0 - 0.7 K/uL   Basophils Relative 0  0 - 1 %   Basophils Absolute 0.0  0.0 - 0.1 K/uL  COMPREHENSIVE METABOLIC PANEL     Status: Abnormal   Collection Time    06/14/13  6:34 PM      Result Value Range   Sodium 142  137 - 147 mEq/L   Potassium 3.8  3.7 - 5.3 mEq/L   Chloride 102  96 - 112 mEq/L   CO2 23  19 - 32 mEq/L   Glucose, Bld 102 (*) 70 - 99 mg/dL   BUN 16  6 - 23 mg/dL   Creatinine, Ser 1.15  0.50 - 1.35 mg/dL   Calcium 9.2  8.4 - 10.5 mg/dL   Total Protein 7.2  6.0 - 8.3 g/dL   Albumin 4.4  3.5 - 5.2 g/dL   AST 22  0 - 37 U/L   ALT 30  0 - 53 U/L   Alkaline Phosphatase 49  39 - 117 U/L   Total Bilirubin 0.5  0.3 - 1.2 mg/dL   GFR calc non Af Amer 83 (*) >90 mL/min   GFR calc Af Amer >90  >90 mL/min   Comment: (NOTE)     The eGFR has been calculated using the CKD EPI equation.     This calculation has not been validated in all clinical situations.     eGFR's persistently <90 mL/min signify possible Chronic Kidney     Disease.  CK     Status: None   Collection Time    06/14/13  6:34 PM      Result Value Range   Total CK 124  7 - 232 U/L  CG4 I-STAT (LACTIC ACID)     Status: None   Collection Time    06/14/13  6:43 PM      Result Value Range   Lactic Acid, Venous 1.06  0.5 - 2.2 mmol/L  CSF CELL COUNT WITH DIFFERENTIAL     Status: Abnormal   Collection Time    06/14/13  7:13 PM      Result Value Range   Tube # 1     Color, CSF RED (*) COLORLESS   Appearance, CSF TURBID (*) CLEAR   Supernatant NOT INDICATED     RBC Count, CSF 60000 (*) 0 /cu mm   WBC, CSF 6 (*) 0 - 5 /cu mm   Other Cells, CSF TOO FEW TO COUNT, SMEAR AVAILABLE FOR REVIEW     Comment: PREDOMINANTLY SEGMENTED NEUTROPHILS WITH LYMPHOCYTES AND EOSINOPHILS CONSISTENT WITH CBC  CSF CELL  COUNT WITH DIFFERENTIAL     Status: Abnormal   Collection Time    06/14/13  7:13 PM      Result Value Range    Tube # 4     Color, CSF RED (*) COLORLESS   Appearance, CSF HAZY (*) CLEAR   Supernatant QUANTITY NOT SUFFICIENT, UNABLE TO PERFORM TEST     RBC Count, CSF 7950 (*) 0 /cu mm   WBC, CSF 4  0 - 5 /cu mm   Other Cells, CSF TOO FEW TO COUNT, SMEAR AVAILABLE FOR REVIEW    CSF CULTURE     Status: None   Collection Time    06/14/13  7:13 PM      Result Value Range   Specimen Description BACK     Special Requests NONE     Gram Stain       Value: NO WBC SEEN     NO ORGANISMS SEEN     Performed by [facility]     Performed at [facility]   Culture       Value: NO GROWTH 1 DAY     Performed at [facility]   Report Status PENDING    GRAM STAIN     Status: None   Collection Time    06/14/13  7:13 PM      Result Value Range   Specimen Description CSF     Special Requests NONE     Gram Stain       Value: NO WBC SEEN     NO ORGANISMS SEEN     Gram Stain Report Called to,Read Back By and Verified With: TOLEDO,T AT 2145 ON [id number] BY HOOKER,B   Report Status 06/14/2013 FINAL    GLUCOSE, CSF     Status: None   Collection Time    06/14/13  7:13 PM      Result Value Range   Glucose, CSF 75  43 - 76 mg/dL  PROTEIN, CSF     Status: Abnormal   Collection Time    06/14/13  7:13 PM      Result Value Range   Total  Protein, CSF 98 (*) 15 - 45 mg/dL  HERPES SIMPLEX VIRUS(HSV) DNA BY PCR     Status: None   Collection Time    06/14/13  7:13 PM      Result Value Range   Specimen source hsv CSF     HSV 1 DNA Not Detected  Not Detected   HSV 2 DNA Not Detected  Not Detected   Comment: (NOTE)     Note:     This assay detects the presence of herpes simplex virus (HSV) DNA by     real-time polymerase chain reaction (PCR) amplification of the virus     polymerase gene.  A result "DETECTED" is reported if a fluorescent     signal for HSV DNA is detected. If HSV DNA is present, the virus is     further characterized into subtype 1 or subtype 2 according      type-specific differences in melting temperature. The assay is     performed in the presence of an internal PCR control to ensure     efficient sample extraction and the absence of PCR inhibitors in the     sample.     This test was developed and its performance characteristics have been     determined by [facility]. Performance characteristics refer  to the analytical performance of the test. This test has not been     cleared or approved by the [place] Food and Drug Administration. The FDA     has determined that such clearance or approval is not necessary. This     laboratory is certified under the [place] as qualified to perform high complexity clinical     laboratory testing.     Performed at [place], BLOOD (ROUTINE X 2)     Status: None   Collection Time    06/14/13 11:10 PM      Result Value Range   Specimen Description BLOOD LEFT ANTECUBITAL     Special Requests BOTTLES DRAWN AEROBIC AND ANAEROBIC 5CC     Culture  Setup Time       Value: 06/15/2013 08:54     Performed at [facility]   Culture       Value:        BLOOD CULTURE RECEIVED NO GROWTH TO DATE CULTURE WILL BE HELD FOR 5 DAYS BEFORE ISSUING A FINAL NEGATIVE REPORT     Performed at [facility]   Report Status PENDING    CULTURE, BLOOD (ROUTINE X 2)     Status: None   Collection Time    06/14/13 11:20 PM      Result Value Range   Specimen Description BLOOD LEFT HAND     Special Requests BOTTLES DRAWN AEROBIC AND ANAEROBIC 5CC     Culture  Setup Time       Value: 06/15/2013 08:52     Performed at [facility]   Culture       Value:        BLOOD CULTURE RECEIVED NO GROWTH TO DATE CULTURE WILL BE HELD FOR 5 DAYS BEFORE ISSUING A FINAL NEGATIVE REPORT     Performed at [facility]   Report Status PENDING    COMPREHENSIVE METABOLIC PANEL     Status: Abnormal   Collection Time    06/15/13  5:30 AM       Result Value Range   Sodium 143  137 - 147 mEq/L   Potassium 4.0  3.7 - 5.3 mEq/L   Chloride 106  96 - 112 mEq/L   CO2 23  19 - 32 mEq/L   Glucose, Bld 101 (*) 70 - 99 mg/dL   BUN 13  6 - 23 mg/dL   Creatinine, Ser 1.04  0.50 - 1.35 mg/dL   Calcium 8.2 (*) 8.4 - 10.5 mg/dL   Total Protein 6.5  6.0 - 8.3 g/dL   Albumin 3.9  3.5 - 5.2 g/dL   AST 19  0 - 37 U/L   ALT 26  0 - 53 U/L   Alkaline Phosphatase 39  39 - 117 U/L   Total Bilirubin 0.7  0.3 - 1.2 mg/dL   GFR calc non Af Amer >90  >90 mL/min   GFR calc Af Amer >90  >90 mL/min   Comment: (NOTE)     The eGFR has been calculated using the CKD EPI equation.     This calculation has not been validated in all clinical situations.     eGFR's persistently <90 mL/min signify possible Chronic Kidney     Disease.  CBC     Status: Abnormal   Collection Time    06/15/13  5:30 AM      Result Value Range  WBC 10.8 (*) 4.0 - 10.5 K/uL   RBC 4.96  4.22 - 5.81 MIL/uL   Hemoglobin 14.9  13.0 - 17.0 g/dL   HCT 43.8  39.0 - 52.0 %   MCV 88.3  78.0 - 100.0 fL   MCH 30.0  26.0 - 34.0 pg   MCHC 34.0  30.0 - 36.0 g/dL   RDW 15.5  11.5 - 15.5 %   Platelets 221  150 - 400 K/uL  PROTIME-INR     Status: None   Collection Time    06/15/13  5:30 AM      Result Value Range   Prothrombin Time 13.7  11.6 - 15.2 seconds   INR 1.07  0.00 - 1.49  INFLUENZA PANEL BY PCR (TYPE A & B, H1N1)     Status: None   Collection Time    06/15/13  5:53 AM      Result Value Range   Influenza A By PCR NEGATIVE  NEGATIVE   Influenza B By PCR NEGATIVE  NEGATIVE   H1N1 flu by pcr NOT DETECTED  NOT DETECTED   Comment:            The Xpert Flu assay (FDA approved for     nasal aspirates or washes and     nasopharyngeal swab specimens), is     intended as an aid in the diagnosis of     influenza and should not be used as     a sole basis for treatment.     Performed at [facility], [place] MICROSCOPIC     Status: None   Collection  Time    06/15/13  7:57 AM      Result Value Range   Color, Urine YELLOW  YELLOW   APPearance CLEAR  CLEAR   Specific Gravity, Urine 1.024  1.005 - 1.030   pH 6.0  5.0 - 8.0   Glucose, UA NEGATIVE  NEGATIVE mg/dL   Hgb urine dipstick NEGATIVE  NEGATIVE   Bilirubin Urine NEGATIVE  NEGATIVE   Ketones, ur NEGATIVE  NEGATIVE mg/dL   Protein, ur NEGATIVE  NEGATIVE mg/dL   Urobilinogen, UA 0.2  0.0 - 1.0 mg/dL   Nitrite NEGATIVE  NEGATIVE   Leukocytes, UA NEGATIVE  NEGATIVE   Comment: MICROSCOPIC NOT DONE ON URINES WITH NEGATIVE PROTEIN, BLOOD, LEUKOCYTES, NITRITE, OR GLUCOSE <1000 mg/dL.  URINE CULTURE     Status: None   Collection Time    06/15/13  7:57 AM      Result Value Range   Specimen Description URINE, RANDOM     Special Requests NONE     Culture  Setup Time       Value: 06/15/2013 13:44     Performed at [place]       Value: NO GROWTH     Performed at [facility]   Culture       Value: NO GROWTH     Performed at [facility]   Report Status 06/16/2013 FINAL    CBC     Status: Abnormal   Collection Time    06/16/13  5:30 AM      Result Value Range   WBC 12.5 (*) 4.0 - 10.5 K/uL   RBC 4.91  4.22 - 5.81 MIL/uL   Hemoglobin 14.8  13.0 - 17.0 g/dL   HCT 42.5  39.0 - 52.0 %   MCV 86.6  78.0 - 100.0 fL  MCH 30.1  26.0 - 34.0 pg   MCHC 34.8  30.0 - 36.0 g/dL   RDW 15.0  11.5 - 15.5 %   Platelets 236  150 - 400 K/uL  COMPREHENSIVE METABOLIC PANEL     Status: None   Collection Time    06/16/13  5:30 AM      Result Value Range   Sodium 137  137 - 147 mEq/L   Potassium 3.9  3.7 - 5.3 mEq/L   Chloride 101  96 - 112 mEq/L   CO2 19  19 - 32 mEq/L   Glucose, Bld 88  70 - 99 mg/dL   BUN 12  6 - 23 mg/dL   Creatinine, Ser 0.83  0.50 - 1.35 mg/dL   Calcium 8.6  8.4 - 10.5 mg/dL   Total Protein 6.7  6.0 - 8.3 g/dL   Albumin 4.0  3.5 - 5.2 g/dL   AST 26  0 - 37 U/L   ALT 23  0 - 53 U/L   Alkaline Phosphatase 41  39 - 117 U/L    Total Bilirubin 0.7  0.3 - 1.2 mg/dL   GFR calc non Af Amer >90  >90 mL/min   GFR calc Af Amer >90  >90 mL/min   Comment: (NOTE)     The eGFR has been calculated using the CKD EPI equation.     This calculation has not been validated in all clinical situations.     eGFR's persistently <90 mL/min signify possible Chronic Kidney     Disease.    [name] Wo Contrast  06/15/2013   CLINICAL DATA:  Altered mental status.  History of trauma.  EXAM: MRI HEAD WITHOUT AND WITH CONTRAST  TECHNIQUE: Multiplanar, multiecho pulse sequences of the brain and surrounding structures were obtained without and with intravenous contrast.  CONTRAST:  55m MULTIHANCE GADOBENATE DIMEGLUMINE 529 MG/ML IV SOLN  COMPARISON:  CT head 06/14/2013.  FINDINGS: The patient was unable to remain motionless for the exam. Small or subtle lesions could be overlooked.  No evidence for acute infarction, hemorrhage, mass lesion, hydrocephalus, or extra-axial fluid. Borderline premature cerebral atrophy. No white matter disease. Flow voids are maintained throughout the carotid, basilar, and vertebral arteries. There are no areas of chronic hemorrhage. Post infusion, no abnormal enhancement of the brain or meninges.  Pituitary, pineal, and cerebellar tonsils unremarkable. No upper cervical lesions. Visualized calvarium, skull base, and upper cervical osseous structures unremarkable. Scalp and extracranial soft tissues, orbits, sinuses, and mastoids show no acute process. Asymmetric hypertrophic change is observed in the left orbital roof (image 24 series 9 for instance).  Good general agreement with prior CT.  IMPRESSION: Motion degraded exam. Small or subtle lesions could be overlooked.  No acute intracranial findings are evident.  Borderline premature cerebral atrophy   Electronically Signed   By: JRolla FlattenM.D.   On: 06/15/2013 15:35    Positive for Auditory and visual hallucinations and paranoid delusions. Blood pressure 134/81, pulse  94, temperature 98.2 F (36.8 C), temperature source Oral, resp. rate 20, height _0  (1.727 m), weight 180 lb (81.647 kg), SpO2 98.00%.   Assessment/Plan: Psychosis not otherwise specified, delirium  Recommendation: Start Risperdal 0.5 mg at bedtime if not medically contraindicated.  Continue sitter for patient safety.  His mental status is improving from the past.  Psychiatry will followup to monitor the progress  Sybella Harnish T. 06/16/2013, 2:55 PM

## 2013-06-16 NOTE — Progress Notes (Addendum)
[facility] PROGRESS NOTE  Paul Coleman ZOX:[id number] DOB: 16-Oct-1980 DOA: 06/14/2013 PCP: No PCP Per Patient  Assessment/Plan:  1. Encephalopathy -differential includes psychosis, likely potentiated by medications,  serotonin syndrome appears less likely -my suspicion for encephalitis remains very low, LP was difficult and consistent with traumatic tap, CSF protein can be elevated with traumatic tap -CSW gram stain and cultures negative so far -HSV in CSF also negative -MRI without acute findings, EEG unremarkable -d/w Neuro, will DC Vanc/ROcephin -HSV negative too -Haldol PRN for now -Neuro following -Psych following, start low dose Risperdone QHS per Dr.Arfeen  2. H/o depression -currently Wellbutrin and Paxil on hold  DVT proph: lovenox  Code Status:Full Code Family Communication: none at bedside Disposition Plan: inpatient   Consultants:  Neuro  Psych  HPI/Subjective: Complains of pain in all legs, reports that his limbs are dying  Objective: Filed Vitals:   06/16/13 0627  BP: 147/77  Pulse: 98  Temp: 97.9 F (36.6 C)  Resp: 18    Intake/Output Summary (Last 24 hours) at 06/16/13 0848 Last data filed at 06/16/13 8119  Gross per 24 hour  Intake 5958.25 ml  Output   1525 ml  Net 4433.25 ml   Filed Weights   06/14/13 1478  Weight: 81.647 kg (180 lb)    Exam:   General:  Aert, eyes closed, anxious, oriented to self, place, time, talks about his legs/limbs dying  Cardiovascular: S1S2/RRR  Respiratory: CTAB  Abdomen: soft, NT, BS present  Musculoskeletal: no edema c/c  Neuro: increased tone in lower ext. Increased reflexes, plantars withdrawal  Strength 5/5 in upper and lower ext  Data Reviewed: Basic Metabolic Panel:  Recent Labs Lab 06/11/13 0850 06/14/13 0732 06/14/13 1834 06/15/13 0530 06/16/13 0530  NA 142 141 142 143 137  K 3.9 4.1 3.8 4.0 3.9  CL 102 102 102 106 101  CO2 27 24 23 23 19   GLUCOSE 103* 104* 102*  101* 88  BUN 7 15 16 13 12   CREATININE 0.95 1.21 1.15 1.04 0.83  CALCIUM 9.0 10.0 9.2 8.2* 8.6   Liver Function Tests:  Recent Labs Lab 06/11/13 0850 06/14/13 0732 06/14/13 1834 06/15/13 0530 06/16/13 0530  AST 16 23 22 19 26   ALT 19 29 30 26 23   ALKPHOS 44 48 49 39 41  BILITOT 0.4 0.7 0.5 0.7 0.7  PROT 7.2 7.6 7.2 6.5 6.7  ALBUMIN 4.2 4.7 4.4 3.9 4.0    Recent Labs Lab 06/11/13 0850  LIPASE 23    Recent Labs Lab 06/14/13 0732  AMMONIA 37   CBC:  Recent Labs Lab 06/09/13 2100 06/11/13 0850 06/14/13 0732 06/14/13 1834 06/15/13 0530 06/16/13 0530  WBC 9.2 8.3 11.6* 11.3* 10.8* 12.5*  NEUTROABS 4.7 5.1 7.9* 6.2  --   --   HGB 16.5 16.3 17.0 16.5 14.9 14.8  HCT 46.3 46.8 48.1 46.6 43.8 42.5  MCV 86.2 88.3 87.0 86.8 88.3 86.6  PLT 259 224 255 281 221 236   Cardiac Enzymes:  Recent Labs Lab 06/14/13 1834  CKTOTAL 124   BNP (last 3 results) No results found for this basename: PROBNP,  in the last 8760 hours CBG:  Recent Labs Lab 06/09/13 2153  GLUCAP 97    Recent Results (from the past 240 hour(s))  CSF CULTURE     Status: None   Collection Time    06/14/13  7:13 PM      Result Value Range Status   Specimen Description BACK   Final  Special Requests NONE   Final   Gram Stain     Final   Value: NO WBC SEEN     NO ORGANISMS SEEN     Performed by [facility]     Performed at [facility]   Culture PENDING   Incomplete   Report Status PENDING   Incomplete  GRAM STAIN     Status: None   Collection Time    06/14/13  7:13 PM      Result Value Range Status   Specimen Description CSF   Final   Special Requests NONE   Final   Gram Stain     Final   Value: NO WBC SEEN     NO ORGANISMS SEEN     Gram Stain Report Called to,Read Back By and Verified With: TOLEDO,T AT 2145 ON [id number] BY HOOKER,B   Report Status 06/14/2013 FINAL   Final  CULTURE, BLOOD (ROUTINE X 2)     Status: None   Collection Time    06/14/13 11:10 PM       Result Value Range Status   Specimen Description BLOOD LEFT ANTECUBITAL   Final   Special Requests BOTTLES DRAWN AEROBIC AND ANAEROBIC 5CC   Final   Culture  Setup Time     Final   Value: 06-28-13 08:54     Performed at [facility]   Culture     Final   Value:        BLOOD CULTURE RECEIVED NO GROWTH TO DATE CULTURE WILL BE HELD FOR 5 DAYS BEFORE ISSUING A FINAL NEGATIVE REPORT     Performed at [facility]   Report Status PENDING   Incomplete  CULTURE, BLOOD (ROUTINE X 2)     Status: None   Collection Time    06/14/13 11:20 PM      Result Value Range Status   Specimen Description BLOOD LEFT HAND   Final   Special Requests BOTTLES DRAWN AEROBIC AND ANAEROBIC 5CC   Final   Culture  Setup Time     Final   Value: Jun 28, 2013 08:52     Performed at [facility]   Culture     Final   Value:        BLOOD CULTURE RECEIVED NO GROWTH TO DATE CULTURE WILL BE HELD FOR 5 DAYS BEFORE ISSUING A FINAL NEGATIVE REPORT     Performed at [facility]   Report Status PENDING   Incomplete     Studies: Mr Laqueta Jean Wo Contrast  06-28-13   CLINICAL DATA:  Altered mental status.  History of trauma.  EXAM: MRI HEAD WITHOUT AND WITH CONTRAST  TECHNIQUE: Multiplanar, multiecho pulse sequences of the brain and surrounding structures were obtained without and with intravenous contrast.  CONTRAST:  17mL MULTIHANCE GADOBENATE DIMEGLUMINE 529 MG/ML IV SOLN  COMPARISON:  CT head 06/14/2013.  FINDINGS: The patient was unable to remain motionless for the exam. Small or subtle lesions could be overlooked.  No evidence for acute infarction, hemorrhage, mass lesion, hydrocephalus, or extra-axial fluid. Borderline premature cerebral atrophy. No white matter disease. Flow voids are maintained throughout the carotid, basilar, and vertebral arteries. There are no areas of chronic hemorrhage. Post infusion, no abnormal enhancement of the brain or meninges.  Pituitary, pineal, and cerebellar tonsils  unremarkable. No upper cervical lesions. Visualized calvarium, skull base, and upper cervical osseous structures unremarkable. Scalp and extracranial soft tissues, orbits, sinuses, and mastoids show no acute process. Asymmetric hypertrophic  change is observed in the left orbital roof (image 24 series 9 for instance).  Good general agreement with prior CT.  IMPRESSION: Motion degraded exam. Small or subtle lesions could be overlooked.  No acute intracranial findings are evident.  Borderline premature cerebral atrophy   Electronically Signed   By: Davonna BellingJohn  Curnes M.D.   On: 06/15/2013 15:35    Scheduled Meds: . acyclovir  700 mg Intravenous Q8H  . enoxaparin (LOVENOX) injection  40 mg Subcutaneous Q24H  . influenza vac split quadrivalent PF  0.5 mL Intramuscular Tomorrow-1000  . nicotine  21 mg Transdermal Daily  . pneumococcal 23 valent vaccine  0.5 mL Intramuscular Tomorrow-1000  . sodium chloride  3 mL Intravenous Q12H   Continuous Infusions: . sodium chloride 125 mL/hr at 06/16/13 0301    Principal Problem:   Acute encephalopathy Active Problems:   Altered mental status   Depression    Time spent: 35min    Md Surgical Solutions LLCJOSEPH,Bryauna Byrum  [facility] Pager [phone]. If 7PM-7AM, please contact night-coverage at [email or web address], password [email or web address] 06/16/2013, 8:48 AM  LOS: 2 days

## 2013-06-17 LAB — COMPREHENSIVE METABOLIC PANEL
ALT: 24 U/L (ref 0–53)
AST: 28 U/L (ref 0–37)
Albumin: 3.6 g/dL (ref 3.5–5.2)
Alkaline Phosphatase: 41 U/L (ref 39–117)
BUN: 9 mg/dL (ref 6–23)
CALCIUM: 8.8 mg/dL (ref 8.4–10.5)
CO2: 24 mEq/L (ref 19–32)
Chloride: 103 mEq/L (ref 96–112)
Creatinine, Ser: 0.92 mg/dL (ref 0.50–1.35)
GLUCOSE: 100 mg/dL — AB (ref 70–99)
Potassium: 4 mEq/L (ref 3.7–5.3)
SODIUM: 141 meq/L (ref 137–147)
Total Bilirubin: 0.6 mg/dL (ref 0.3–1.2)
Total Protein: 6.6 g/dL (ref 6.0–8.3)

## 2013-06-17 LAB — CBC
HCT: 44.2 % (ref 39.0–52.0)
HEMOGLOBIN: 15.2 g/dL (ref 13.0–17.0)
MCH: 30.1 pg (ref 26.0–34.0)
MCHC: 34.4 g/dL (ref 30.0–36.0)
MCV: 87.5 fL (ref 78.0–100.0)
Platelets: 216 10*3/uL (ref 150–400)
RBC: 5.05 MIL/uL (ref 4.22–5.81)
RDW: 15.1 % (ref 11.5–15.5)
WBC: 10 10*3/uL (ref 4.0–10.5)

## 2013-06-17 MED ORDER — TRAMADOL HCL 50 MG PO TABS: 50.0000 mg | ORAL_TABLET | Freq: Four times a day (QID) | ORAL | Status: AC | PRN

## 2013-06-17 MED ORDER — LORAZEPAM 1 MG PO TABS: 1.0000 mg | ORAL_TABLET | Freq: Every day | ORAL | Status: AC | PRN

## 2013-06-17 MED ORDER — NICOTINE 21 MG/24HR TD PT24: 21.0000 mg | MEDICATED_PATCH | Freq: Every day | TRANSDERMAL | Status: AC

## 2013-06-17 MED ORDER — IBUPROFEN 400 MG PO TABS: 400.0000 mg | ORAL_TABLET | Freq: Four times a day (QID) | ORAL | Status: AC | PRN

## 2013-06-17 MED ORDER — RISPERIDONE 0.5 MG PO TABS: 0.5000 mg | ORAL_TABLET | Freq: Every day | ORAL | Status: AC

## 2013-06-17 MED ORDER — ONDANSETRON 8 MG PO TBDP: 8.0000 mg | ORAL_TABLET | Freq: Three times a day (TID) | ORAL | Status: AC | PRN

## 2013-06-17 NOTE — Progress Notes (Signed)
Discharge instructions explained to pt and prescriptions given to pt. States understanding instructions.

## 2013-06-17 NOTE — Progress Notes (Signed)
Clinical Social Work  Per chart review, psych MD reports patient can follow up on outpatient basis. CSW met with patient at bedside and reports he is feeling well. Patient reports no hallucinations and is excited to be able to DC. Patient reports friend will pick him up and plans to stay with him until sister arrives. CSW assisted patient in calling [facility] and scheduling a follow up appointment with Dr. Dustin Flock at 06/26/13 at 2:15pm. CSW placed follow up information on AVS as well.  CSW updated MD and RN of plans. CSW is signing off but available if further needs arise.  [place], [place] [phone]

## 2013-06-17 NOTE — Progress Notes (Signed)
NEURO HOSPITALIST PROGRESS NOTE   SUBJECTIVE:                                                                                                                        Patient is feeling back to his baseline and much better.    OBJECTIVE:                                                                                                                           Vital signs in last 24 hours: Temp:  [98.2 F (36.8 C)] 98.2 F (36.8 C) (01/21 0612) Pulse Rate:  [87-103] 87 (01/21 0612) Resp:  [18-20] 18 (01/21 0612) BP: (124-143)/(72-90) 143/90 mmHg (01/21 0612) SpO2:  [97 %-98 %] 98 % (01/21 0612)  Intake/Output from previous day: 01/20 0701 - 01/21 0700 In: 1971.3 [P.O.:820; I.V.:1151.3] Out: -  Intake/Output this shift:   Nutritional status: General  Past Medical History  Diagnosis Date  . GERD (gastroesophageal reflux disease)   . Depression      Neurologic Exam:  Mental Status: Alert, oriented, thought content appropriate.  Speech fluent without evidence of aphasia.  Able to follow 3 step commands without difficulty. Cranial Nerves: II: Discs flat bilaterally; Visual fields grossly normal, pupils equal, round, reactive to light and accommodation III,IV, VI: ptosis not present, extra-ocular motions intact bilaterally V,VII: smile symmetric, facial light touch sensation normal bilaterally VIII: hearing normal bilaterally IX,X: gag reflex present XI: bilateral shoulder shrug XII: midline tongue extension without atrophy or fasciculations  Motor: Right : Upper extremity   5/5    Left:     Upper extremity   5/5  Lower extremity   5/5     Lower extremity   5/5 Tone and bulk:normal tone throughout; no atrophy noted Sensory: Pinprick and light touch intact throughout, bilaterally Deep Tendon Reflexes:  Right: Upper Extremity   Left: Upper extremity   biceps (C-5 to C-6) 2/4   biceps (C-5 to C-6) 2/4 tricep (C7) 2/4    triceps (C7)  2/4 Brachioradialis (C6) 2/4  Brachioradialis (C6) 2/4  Lower Extremity Lower Extremity  quadriceps (L-2 to L-4) 2/4   quadriceps (L-2 to L-4) 2/4 Achilles (S1) 2/4   Achilles (S1) 2/4  Plantars: Right: downgoing  Left: downgoing Cerebellar: normal finger-to-nose,  normal heel-to-shin test CV: pulses palpable throughout    Lab Results: Basic Metabolic Panel:  Recent Labs Lab 06/14/13 0732 06/14/13 1834 06/15/13 0530 06/16/13 0530 06/17/13 0530  NA 141 142 143 137 141  K 4.1 3.8 4.0 3.9 4.0  CL 102 102 106 101 103  CO2 24 23 23 19 24   GLUCOSE 104* 102* 101* 88 100*  BUN 15 16 13 12 9   CREATININE 1.21 1.15 1.04 0.83 0.92  CALCIUM 10.0 9.2 8.2* 8.6 8.8    Liver Function Tests:  Recent Labs Lab 06/14/13 0732 06/14/13 1834 06/15/13 0530 06/16/13 0530 06/17/13 0530  AST 23 22 19 26 28   ALT 29 30 26 23 24   ALKPHOS 48 49 39 41 41  BILITOT 0.7 0.5 0.7 0.7 0.6  PROT 7.6 7.2 6.5 6.7 6.6  ALBUMIN 4.7 4.4 3.9 4.0 3.6    Recent Labs Lab 06/11/13 0850  LIPASE 23    Recent Labs Lab 06/14/13 0732  AMMONIA 37    CBC:  Recent Labs Lab 06/11/13 0850 06/14/13 0732 06/14/13 1834 06/15/13 0530 06/16/13 0530 06/17/13 0530  WBC 8.3 11.6* 11.3* 10.8* 12.5* 10.0  NEUTROABS 5.1 7.9* 6.2  --   --   --   HGB 16.3 17.0 16.5 14.9 14.8 15.2  HCT 46.8 48.1 46.6 43.8 42.5 44.2  MCV 88.3 87.0 86.8 88.3 86.6 87.5  PLT 224 255 281 221 236 216    Cardiac Enzymes:  Recent Labs Lab 06/14/13 1834  CKTOTAL 124    Lipid Panel: No results found for this basename: CHOL, TRIG, HDL, CHOLHDL, VLDL, LDLCALC,  in the last 168 hours  CBG: No results found for this basename: GLUCAP,  in the last 168 hours  Microbiology: Results for orders placed during the hospital encounter of 06/14/13  CSF CULTURE     Status: None   Collection Time    06/14/13  7:13 PM      Result Value Range Status   Specimen Description BACK   Final   Special Requests NONE   Final   Gram Stain      Final   Value: NO WBC SEEN     NO ORGANISMS SEEN     Performed by [facility]     Performed at [facility]   Culture     Final   Value: NO GROWTH 1 DAY     Performed at [facility]   Report Status PENDING   Incomplete  GRAM STAIN     Status: None   Collection Time    06/14/13  7:13 PM      Result Value Range Status   Specimen Description CSF   Final   Special Requests NONE   Final   Gram Stain     Final   Value: NO WBC SEEN     NO ORGANISMS SEEN     Gram Stain Report Called to,Read Back By and Verified With: TOLEDO,T AT 2145 ON [id number] BY HOOKER,B   Report Status 06/14/2013 FINAL   Final  CULTURE, BLOOD (ROUTINE X 2)     Status: None   Collection Time    06/14/13 11:10 PM      Result Value Range Status   Specimen Description BLOOD LEFT ANTECUBITAL   Final   Special Requests BOTTLES DRAWN AEROBIC AND ANAEROBIC 5CC   Final   Culture  Setup Time     Final   Value: 06/15/2013 08:54  Performed at [facility]     Final   Value:        BLOOD CULTURE RECEIVED NO GROWTH TO DATE CULTURE WILL BE HELD FOR 5 DAYS BEFORE ISSUING A FINAL NEGATIVE REPORT     Performed at [facility]   Report Status PENDING   Incomplete  CULTURE, BLOOD (ROUTINE X 2)     Status: None   Collection Time    06/14/13 11:20 PM      Result Value Range Status   Specimen Description BLOOD LEFT HAND   Final   Special Requests BOTTLES DRAWN AEROBIC AND ANAEROBIC 5CC   Final   Culture  Setup Time     Final   Value: 06/15/2013 08:52     Performed at [facility]   Culture     Final   Value:        BLOOD CULTURE RECEIVED NO GROWTH TO DATE CULTURE WILL BE HELD FOR 5 DAYS BEFORE ISSUING A FINAL NEGATIVE REPORT     Performed at [facility]   Report Status PENDING   Incomplete  URINE CULTURE     Status: None   Collection Time    06/15/13  7:57 AM      Result Value Range Status   Specimen Description URINE, RANDOM   Final   Special Requests  NONE   Final   Culture  Setup Time     Final   Value: 06/15/2013 13:44     Performed at [facility]     Final   Value: NO GROWTH     Performed at [facility]   Culture     Final   Value: NO GROWTH     Performed at [facility]   Report Status 06/16/2013 FINAL   Final    Coagulation Studies:  Recent Labs  06/15/13 0530  LABPROT 13.7  INR 1.07    Imaging: Mr Laqueta Jean ZO Contrast  06/15/2013   CLINICAL DATA:  Altered mental status.  History of trauma.  EXAM: MRI HEAD WITHOUT AND WITH CONTRAST  TECHNIQUE: Multiplanar, multiecho pulse sequences of the brain and surrounding structures were obtained without and with intravenous contrast.  CONTRAST:  17mL MULTIHANCE GADOBENATE DIMEGLUMINE 529 MG/ML IV SOLN  COMPARISON:  CT head 06/14/2013.  FINDINGS: The patient was unable to remain motionless for the exam. Small or subtle lesions could be overlooked.  No evidence for acute infarction, hemorrhage, mass lesion, hydrocephalus, or extra-axial fluid. Borderline premature cerebral atrophy. No white matter disease. Flow voids are maintained throughout the carotid, basilar, and vertebral arteries. There are no areas of chronic hemorrhage. Post infusion, no abnormal enhancement of the brain or meninges.  Pituitary, pineal, and cerebellar tonsils unremarkable. No upper cervical lesions. Visualized calvarium, skull base, and upper cervical osseous structures unremarkable. Scalp and extracranial soft tissues, orbits, sinuses, and mastoids show no acute process. Asymmetric hypertrophic change is observed in the left orbital roof (image 24 series 9 for instance).  Good general agreement with prior CT.  IMPRESSION: Motion degraded exam. Small or subtle lesions could be overlooked.  No acute intracranial findings are evident.  Borderline premature cerebral atrophy   Electronically Signed   By: Davonna Belling M.D.   On: 06/15/2013 15:35       MEDICATIONS  Scheduled: . enoxaparin (LOVENOX) injection  40 mg Subcutaneous Q24H  . nicotine  21 mg Transdermal Daily  . pantoprazole  40 mg Oral Q1200  . risperiDONE  0.5 mg Oral QHS  . sodium chloride  3 mL Intravenous Q12H    ASSESSMENT/PLAN:                                                                                                           Patietn is currently back to his baseline. HSV negative thus may D/C Acyclovir.  His return to baseline is very reassuring. Acute psychosis of unclear etiology, however patient was on both Ultram and Zofran prior to admission.  The combination of these two medications can cause serotonin syndrome and this would still be in the differential. Agree with discontinuing combination of Zofran and Ultram. Would give definite consideration prior to using serotonergic medications.   Neurology will sign off at this time. Please call with any question.  Assessment and plan discussed with with attending physician and they are in agreement.    Felicie Morn PA-C [facility] [phone]  06/17/2013, 9:32 AM

## 2013-06-17 NOTE — Discharge Instructions (Signed)
Delirium Delirium (acute confusional state) is a sudden change in a person's brain function that causes the person to become confused for a short period of time. People with delirium often have trouble knowing where they are. Delirium comes on very fast. It can develop in a few days or just a few hours. Delirium usually occurs because of another mental or physical condition. For example, a person might develop delirium after a surgery. It is especially common in elderly people who are sick or in the hospital. People with dementia (a brain disease like Alzheimer's) or people who are near death may also develop delirium.  CAUSES  Delirium occurs when something affects the signals that the brain sends out. These signals can be affected by anything that puts stress on the body and brain, causing brain chemicals to be out of balance. Structural health problems such as acute strokes, bleeding near the brain (intracranial bleeds), and trauma can also cause delirium. Sometimes the exact cause of delirium is not known. Usually, several factors contribute to the development of delirium. Things that may cause a person to develop delirium include:  Surgery, especially when anesthetics are involved.  Chronic medical conditions, such as chronic lung, heart, or kidney disease.  Fever.  Low body temperature (hypothermia).  Infection, such as pneumonia, severe intestinal infection, severe skin infection, or urinary tract infection.  Poor nutrition that leads to very low vitamin or protein levels in the body (malnutrition).  Body fluid loss (dehydration).  Low blood sugar.  High blood sugar, which typically occurs in people with severe diabetes.  Electrolyte abnormalities, such as sodium imbalance or acid-base disorders.  Low oxygen level.  Low blood pressure.  Uncontrolled high blood pressure.  Brain injury (trauma).  Stroke.  Abuse of alcohol or sudden withdrawal of alcohol.  Sudden tobacco  withdrawal if the person is a longtime smoker.  Loss of vision or hearing.  Being strapped down (restrained).  Being in a new setting, such as an elderly person being admitted to a hospital.  Taking illegal drugs or quitting use of those drugs.  Taking certain medicines for pain, sleep, allergies, high blood pressure, anxiety, depression, Parkinson's disease, or seizures.  Sundowning syndrome, which is a complication of chronic dementia that can occur in the later part of a person's wake cycle. SYMPTOMS  The main sign of delirium is a sudden change in a person's mental state. This change can come and go. Symptoms may include:  Not being able to pay attention.  Being confused about places, time, and people.  Seeing, hearing, or feeling things that are not real (hallucinations).  Changes in sleep patterns.  Being restless, hyperactive, irritable, and angry.  Extreme mood swings.  Rambling and senseless talking.  Difficulty speaking or understanding speech.  Memory loss.  Changes in consciousness, such as being sleepy, sluggish, lethargic, and withdrawn.  Focusing on things or ideas that are not important.  Unusual body movements or shaking (tremors). DIAGNOSIS  There are no specific tests for delirium, but a caregiver may do the following:  Perform a mental status assessment.The caregiver will check for confusion and lack of awareness by talking with the person and asking questions.  Talk with the person's friends and family. A friend or family member will often need to tell the caregiver about the person's symptoms and medical history, including medicines taken or missed.  Perform a physical exam. The caregiver will perform a physical exam to check for underlying conditions that may lead to delirium, such as  dehydration, malnutrition, trauma, and infection. The exam may include checking for changes in vision, hearing, and the way the person moves (coordination and  reflexes). The caregiver may order tests, such as: °· Blood tests. °· Urine tests. °· Brain imaging, such as a CT scan or MRI scan. °· X-rays (to look for lung problems or intestinal blockage). °TREATMENT  °Treatment will focus on the cause of the delirium. Delirium is a sign of another problem. If that problem can be found and treated, the delirium may go away. Full recovery can take several weeks. Keeping the person safe until the cause can be found and treated (supportive care) is often what is needed. In some cases, medicine may be prescribed to help keep the person calm. People with delirium should not be left alone because they may involuntarily harm themselves. °HOME CARE INSTRUCTIONS  °Supportive care is provided by the person living with, or caring for, the person with delirium. It involves keeping the person safe, encouraging healthy habits, and helping the person stay aware of his or her surroundings. Take these steps to help care for someone with delirium: °· Make sure the person eats a healthy diet. °· Make sure the person gets enough fluids. The person should drink enough fluids to keep urine clear or pale yellow. °· Do not leave the person alone. °· Keep the person on a regular schedule. Maintain regular times for meals, sleeping, and being active. °· Keep the home as quiet and stress-free as possible. This is very important at night and will help improve the quality of the person's sleep. °· Let lots of sunlight into the home during the day. °· Avoid total darkness at night. °· Help the person maintain good hygiene to avoid skin infections, bed sores, and pressure ulcers. °· Do activities outside as often as possible. °· Take the person to see others whenever you can. °· Make sure the person uses hearing aids and eyeglasses if needed. °· Give frequent verbal reminders of the current time, location, and situation. °· Use memory cues, such as clocks, calendars, and family photos. °· Try to keep the  person calm. Music or relaxation techniques may be helpful. °· Always be on the lookout for symptoms of delirium. °· Do not use restraints. °· Only give over-the-counter or prescription medicines as directed by the caregiver. °· Make sure the person takes all medicines on a regular schedule as directed by the caregiver. °· Keep all follow-up appointments. °SEEK MEDICAL CARE IF: °· Any of the person's symptoms become worse. °· New signs of delirium develop. °· Caring for the person at home does not seem safe. °· The person stops eating, drinking, or communicating. °· The person develops vomiting. °SEEK IMMEDIATE MEDICAL CARE IF:  °· Symptoms of delirium do not go away after treatment. °· The person develops chest pain or shortness of breath. °· The person seems to want to harm someone or harm himself or herself. °MAKE SURE YOU:  °· Understand these instructions. °· Will watch the condition. °· Will get help right away if the person is not doing well or gets worse. °Document Released: 02/06/2012 Document Reviewed: 02/06/2012 °[employer]® Patient Information ©2014 [employer]. ° °

## 2013-06-17 NOTE — Plan of Care (Signed)
Problem: Discharge Progression Outcomes Goal: Other Discharge Outcomes/Goals Outcome: Completed/Met Date Met:  06/17/13 Discharged to coworker. Sister is coming from [place] to pick up brother and bring him home with her.

## 2013-06-17 NOTE — Consult Note (Signed)
Psychiatry followup note  Paul Coleman is an 33 y.o. male.  Who was initially seen by consultation liaison services because of psychosis and hallucination.    Subjective; Patient seen today.  Patient is doing much better , he is alert oriented and relevant conversation.  He denies any headache.  We started him on Risperdal and he had a good night sleep.  He denied any hallucination or any paranoia.  He denies any suicidal thoughts or homicidal thoughts prior to admission he was taking Wellbutrin, Prozac along with Zofran and Ultram which may contribute to serotonin syndrome.  However patient has some underlying psychiatric illness and he will require psychiatric treatment outpatient.  At this time patient does not have any suicidal thoughts or homicidal thoughts and does not need inpatient psychiatric services.  I spoke to his family members in [place] with his permission of the patient who are willing to come down and patient is going to stay with them for a few days.  Patient feels that he is back to his baseline.  He liked to work from home and he has talked with his supervisor who agreed with the plan.  The patient denies any tremors, shakes, and he is less confused .    Current facility-administered medications:0.9 %  sodium chloride infusion, , Intravenous, Continuous, Domenic Polite, MD, Last Rate: 75 mL/hr at 06/16/13 1413;  acetaminophen (TYLENOL) tablet 650 mg, 650 mg, Oral, Q4H PRN, Larene Pickett, PA-C;  alum & mag hydroxide-simeth (MAALOX/MYLANTA) 200-200-20 MG/5ML suspension 15 mL, 15 mL, Oral, Q4H PRN, Domenic Polite, MD, 15 mL at 06/16/13 1406 enoxaparin (LOVENOX) injection 40 mg, 40 mg, Subcutaneous, Q24H, Berle Mull, MD, 40 mg at 06/16/13 0858;  ibuprofen (ADVIL,MOTRIN) tablet 400 mg, 400 mg, Oral, Q6H PRN, Domenic Polite, MD, 400 mg at 06/15/13 2045;  LORazepam (ATIVAN) tablet 1 mg, 1 mg, Oral, Q8H PRN, Larene Pickett, PA-C, 1 mg at 06/16/13 2210;  nicotine (NICODERM CQ - dosed in  mg/24 hours) patch 21 mg, 21 mg, Transdermal, Daily, Larene Pickett, PA-C, 21 mg at 06/16/13 0903 ondansetron (ZOFRAN) injection 4 mg, 4 mg, Intravenous, Q6H PRN, Berle Mull, MD;  pantoprazole (PROTONIX) EC tablet 40 mg, 40 mg, Oral, Q1200, Domenic Polite, MD, 40 mg at 06/16/13 1406;  risperiDONE (RISPERDAL) tablet 0.5 mg, 0.5 mg, Oral, QHS, Domenic Polite, MD, 0.5 mg at 06/16/13 2053;  sodium chloride 0.9 % injection 3 mL, 3 mL, Intravenous, Q12H, Berle Mull, MD   Past Medical History  Diagnosis Date  . GERD (gastroesophageal reflux disease)   . Depression     History reviewed. No pertinent past surgical history.  History reviewed. No pertinent family history.  Social History:  reports that he has been smoking.  He has never used smokeless tobacco. He reports that he does not drink alcohol or use illicit drugs.  Allergies: No Known Allergies  Medications: I have reviewed the patient's current medications.  Results for orders placed during the hospital encounter of 06/14/13 (from the past 48 hour(s))  CBC     Status: Abnormal   Collection Time    06/16/13  5:30 AM      Result Value Range   WBC 12.5 (*) 4.0 - 10.5 K/uL   RBC 4.91  4.22 - 5.81 MIL/uL   Hemoglobin 14.8  13.0 - 17.0 g/dL   HCT 42.5  39.0 - 52.0 %   MCV 86.6  78.0 - 100.0 fL   MCH 30.1  26.0 - 34.0 pg  MCHC 34.8  30.0 - 36.0 g/dL   RDW 15.0  11.5 - 15.5 %   Platelets 236  150 - 400 K/uL  COMPREHENSIVE METABOLIC PANEL     Status: None   Collection Time    06/16/13  5:30 AM      Result Value Range   Sodium 137  137 - 147 mEq/L   Potassium 3.9  3.7 - 5.3 mEq/L   Chloride 101  96 - 112 mEq/L   CO2 19  19 - 32 mEq/L   Glucose, Bld 88  70 - 99 mg/dL   BUN 12  6 - 23 mg/dL   Creatinine, Ser 0.83  0.50 - 1.35 mg/dL   Calcium 8.6  8.4 - 10.5 mg/dL   Total Protein 6.7  6.0 - 8.3 g/dL   Albumin 4.0  3.5 - 5.2 g/dL   AST 26  0 - 37 U/L   ALT 23  0 - 53 U/L   Alkaline Phosphatase 41  39 - 117 U/L   Total  Bilirubin 0.7  0.3 - 1.2 mg/dL   GFR calc non Af Amer >90  >90 mL/min   GFR calc Af Amer >90  >90 mL/min   Comment: (NOTE)     The eGFR has been calculated using the CKD EPI equation.     This calculation has not been validated in all clinical situations.     eGFR's persistently <90 mL/min signify possible Chronic Kidney     Disease.  CBC     Status: None   Collection Time    06/17/13  5:30 AM      Result Value Range   WBC 10.0  4.0 - 10.5 K/uL   RBC 5.05  4.22 - 5.81 MIL/uL   Hemoglobin 15.2  13.0 - 17.0 g/dL   HCT 44.2  39.0 - 52.0 %   MCV 87.5  78.0 - 100.0 fL   MCH 30.1  26.0 - 34.0 pg   MCHC 34.4  30.0 - 36.0 g/dL   RDW 15.1  11.5 - 15.5 %   Platelets 216  150 - 400 K/uL  COMPREHENSIVE METABOLIC PANEL     Status: Abnormal   Collection Time    06/17/13  5:30 AM      Result Value Range   Sodium 141  137 - 147 mEq/L   Potassium 4.0  3.7 - 5.3 mEq/L   Chloride 103  96 - 112 mEq/L   CO2 24  19 - 32 mEq/L   Glucose, Bld 100 (*) 70 - 99 mg/dL   BUN 9  6 - 23 mg/dL   Creatinine, Ser 0.92  0.50 - 1.35 mg/dL   Calcium 8.8  8.4 - 10.5 mg/dL   Total Protein 6.6  6.0 - 8.3 g/dL   Albumin 3.6  3.5 - 5.2 g/dL   AST 28  0 - 37 U/L   ALT 24  0 - 53 U/L   Alkaline Phosphatase 41  39 - 117 U/L   Total Bilirubin 0.6  0.3 - 1.2 mg/dL   GFR calc non Af Amer >90  >90 mL/min   GFR calc Af Amer >90  >90 mL/min   Comment: (NOTE)     The eGFR has been calculated using the CKD EPI equation.     This calculation has not been validated in all clinical situations.     eGFR's persistently <90 mL/min signify possible Chronic Kidney     Disease.    Mr Paul Coleman  Wo Contrast  06/15/2013   CLINICAL DATA:  Altered mental status.  History of trauma.  EXAM: MRI HEAD WITHOUT AND WITH CONTRAST  TECHNIQUE: Multiplanar, multiecho pulse sequences of the brain and surrounding structures were obtained without and with intravenous contrast.  CONTRAST:  46m MULTIHANCE GADOBENATE DIMEGLUMINE 529 MG/ML IV SOLN   COMPARISON:  CT head 06/14/2013.  FINDINGS: The patient was unable to remain motionless for the exam. Small or subtle lesions could be overlooked.  No evidence for acute infarction, hemorrhage, mass lesion, hydrocephalus, or extra-axial fluid. Borderline premature cerebral atrophy. No white matter disease. Flow voids are maintained throughout the carotid, basilar, and vertebral arteries. There are no areas of chronic hemorrhage. Post infusion, no abnormal enhancement of the brain or meninges.  Pituitary, pineal, and cerebellar tonsils unremarkable. No upper cervical lesions. Visualized calvarium, skull base, and upper cervical osseous structures unremarkable. Scalp and extracranial soft tissues, orbits, sinuses, and mastoids show no acute process. Asymmetric hypertrophic change is observed in the left orbital roof (image 24 series 9 for instance).  Good general agreement with prior CT.  IMPRESSION: Motion degraded exam. Small or subtle lesions could be overlooked.  No acute intracranial findings are evident.  Borderline premature cerebral atrophy   Electronically Signed   By: JRolla FlattenM.D.   On: 06/15/2013 15:35    Positive for Auditory and visual hallucinations and paranoid delusions. Blood pressure 143/90, pulse 87, temperature 98.2 F (36.8 C), temperature source Oral, resp. rate 18, height 5' 8"  (1.727 m), weight 180 lb (81.647 kg), SpO2 98.00%.   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  He can be discharged with follow up appointment to see a psychiatrist.  Social worker please coordinate with a family member and discharge planning.  Please call [phone] there any further questions.  Bana Borgmeyer T. 06/17/2013, 10:11 AM
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
## 2013-06-18 LAB — CSF CULTURE W GRAM STAIN: Culture: NO GROWTH

## 2013-06-18 LAB — CSF CULTURE: GRAM STAIN: NONE SEEN

## 2013-06-18 NOTE — Progress Notes (Signed)
I have reviewed the above note.   Ritta SlotMcNeill Kirkpatrick, MD [facility] [phone]  If 7pm- 7am, please page neurology on call at [phone].

## 2013-06-21 LAB — CULTURE, BLOOD (ROUTINE X 2)
Culture: NO GROWTH
Culture: NO GROWTH

## 2014-01-27 ENCOUNTER — Encounter: Payer: Self-pay | Admitting: Endocrinology

## 2014-01-27 ENCOUNTER — Ambulatory Visit (INDEPENDENT_AMBULATORY_CARE_PROVIDER_SITE_OTHER): Payer: BC Managed Care – PPO | Admitting: Endocrinology

## 2014-01-27 VITALS — BP 126/88 | HR 83 | Temp 98.5°F | Ht 68.0 in | Wt 168.0 lb

## 2014-01-27 DIAGNOSIS — E291 Testicular hypofunction: Secondary | ICD-10-CM

## 2014-01-27 MED ORDER — CLOMIPHENE CITRATE 50 MG PO TABS: ORAL_TABLET | ORAL | Status: DC

## 2014-01-27 MED ORDER — CLOMIPHENE CITRATE 50 MG PO TABS: ORAL_TABLET | ORAL | Status: AC

## 2014-01-27 NOTE — Patient Instructions (Signed)
Please resume the clomiphene, at a lower dosage.  Here is a prescription. Please return in 1 year.

## 2014-01-27 NOTE — Progress Notes (Signed)
   Subjective:    Patient ID: Paul Coleman, male    DOB: Oct 20, 1980, 33 y.o.   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  He stopped clomid approx 4 mos ago.  He felt well on it, but since off it, sxs of ED and fatigue have recurred.  He has no children, but wishes to preserve his fertility for the future.   Past Medical History  Diagnosis Date  . GERD (gastroesophageal reflux disease)   . Depression     No past surgical history on file.  History   Social History  . Marital Status: Married    Spouse Name: N/A    Number of Children: N/A  . Years of Education: N/A   Occupational History  . Not on file.   Social History Main Topics  . Smoking status: Current Every Day Smoker  . Smokeless tobacco: Never Used  . Alcohol Use: No  . Drug Use: No  . Sexual Activity: Not Currently   Other Topics Concern  . Not on file   Social History Narrative  . No narrative on file    Current Outpatient Prescriptions on File Prior to Visit  Medication Sig Dispense Refill  . ibuprofen (ADVIL,MOTRIN) 400 MG tablet Take 1 tablet (400 mg total) by mouth every 6 (six) hours as needed for fever, headache or moderate pain.  30 tablet  0  . LORazepam (ATIVAN) 1 MG tablet Take 1 tablet (1 mg total) by mouth daily as needed for anxiety (agitation).  30 tablet  0  . nicotine (NICODERM CQ - DOSED IN MG/24 HOURS) 21 mg/24hr patch Place 1 patch (21 mg total) onto the skin daily.  28 patch  0  . ondansetron (ZOFRAN-ODT) 8 MG disintegrating tablet Take 1 tablet (8 mg total) by mouth every 8 (eight) hours as needed for nausea or vomiting.  20 tablet  0  . risperiDONE (RISPERDAL) 0.5 MG tablet Take 1 tablet (0.5 mg total) by mouth at bedtime.  30 tablet  3  . traMADol (ULTRAM) 50 MG tablet Take 1-2 tablets (50-100 mg total) by mouth every 6 (six) hours as needed for moderate pain.  30 tablet  1   No  current facility-administered medications on file prior to visit.    No Known Allergies  No family history on file.  BP 126/88  Pulse 83  Temp(Src) 98.5 F (36.9 C) (Oral)  Ht  (1.727 m)  Wt 168 lb (76.204 kg)  BMI 25.55 kg/m2  SpO2 98%    Review of Systems Denies acne.    Objective:   Physical Exam VITAL SIGNS:  See vs page GENERAL: no distress GENITALIA: Normal male testicles, scrotum, and penis Skin: normal hair distribution  outside test results are reviewed: Testosterone=316    Assessment & Plan:  Hypogonadism: mild exacerbation off clomid.   Patient is advised the following: Patient Instructions  Please resume the clomiphene, at a lower dosage.  Here is a prescription. Please return in 1 year.

## 2014-09-30 ENCOUNTER — Telehealth: Payer: Self-pay | Admitting: Endocrinology

## 2014-09-30 NOTE — Telephone Encounter (Signed)
Pt called and said he needs a letter saying what he is being treated for , and all medical conditions, and symptoms also with the date he was sen and the date he was diagnosed please call pt at [phone]

## 2014-10-01 ENCOUNTER — Encounter: Payer: Self-pay | Admitting: Endocrinology

## 2014-10-01 DIAGNOSIS — Z0279 Encounter for issue of other medical certificate: Secondary | ICD-10-CM

## 2014-10-01 NOTE — Telephone Encounter (Signed)
Message fwd to MD via MyChart message from patient.

## 2014-10-04 ENCOUNTER — Encounter: Payer: Self-pay | Admitting: Endocrinology

## 2015-03-24 IMAGING — CR DG ABDOMEN ACUTE W/ 1V CHEST
3 series · 3 of 3 positions shown · non-contrast
Comparison: None.

CLINICAL DATA: Generalized body aches and vomiting.

EXAM:
ACUTE ABDOMEN SERIES (ABDOMEN 2 VIEW & CHEST 1 VIEW)

[w chest pa]
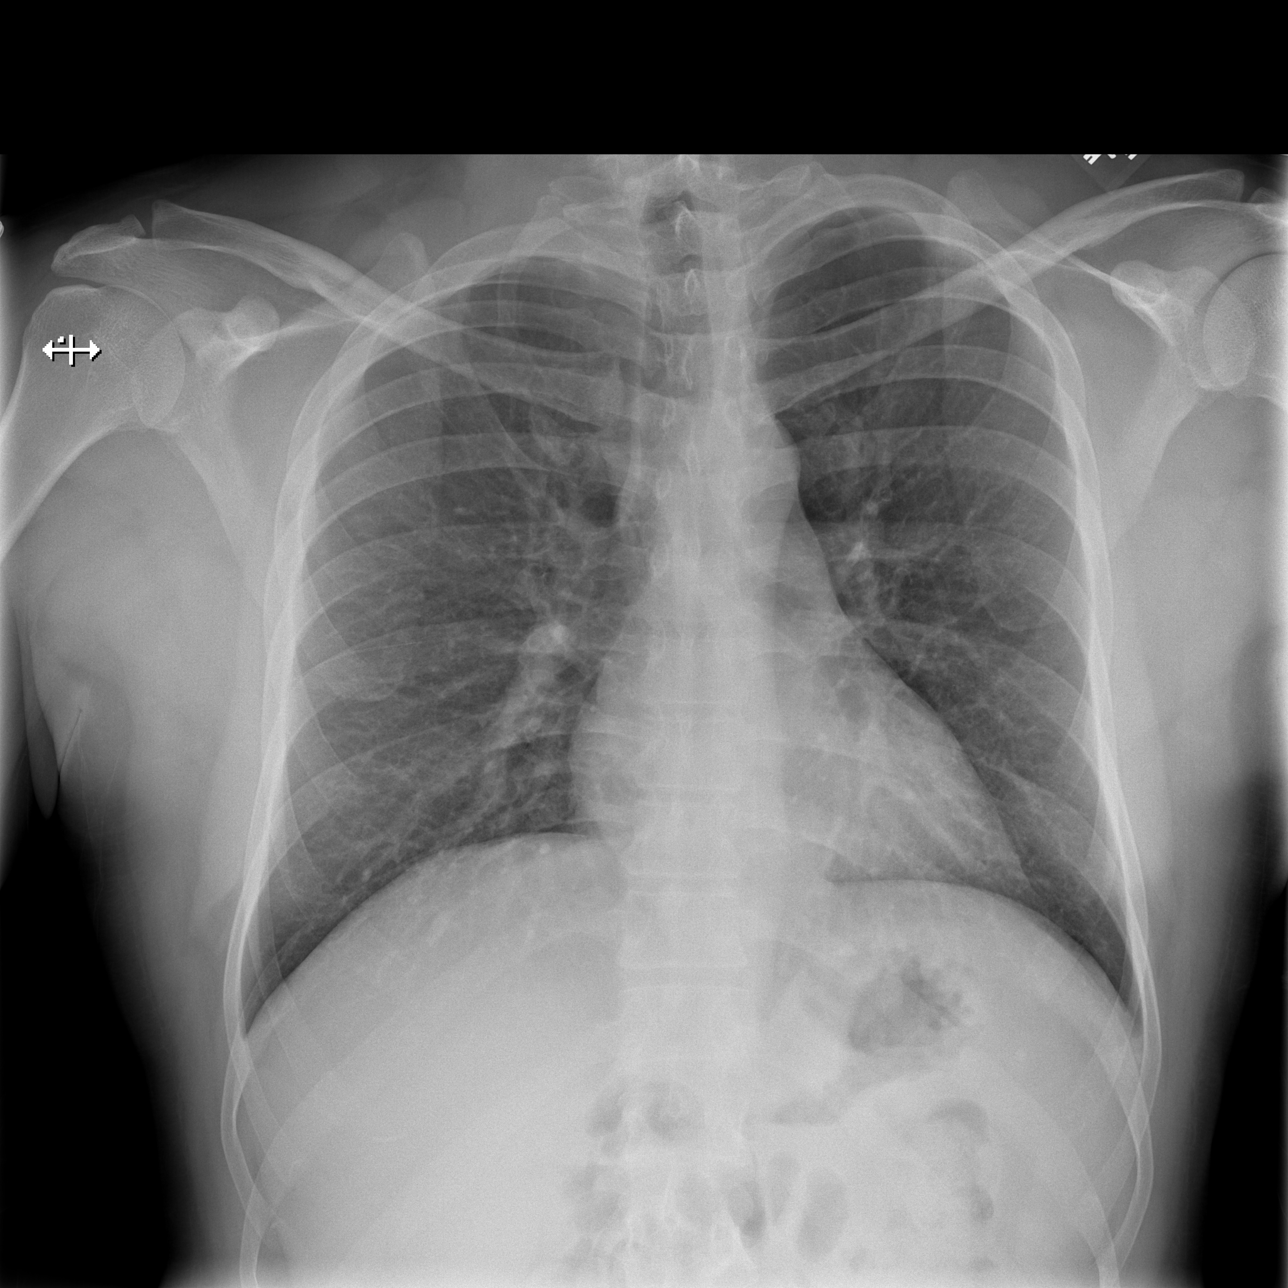

[w abdomen upright]
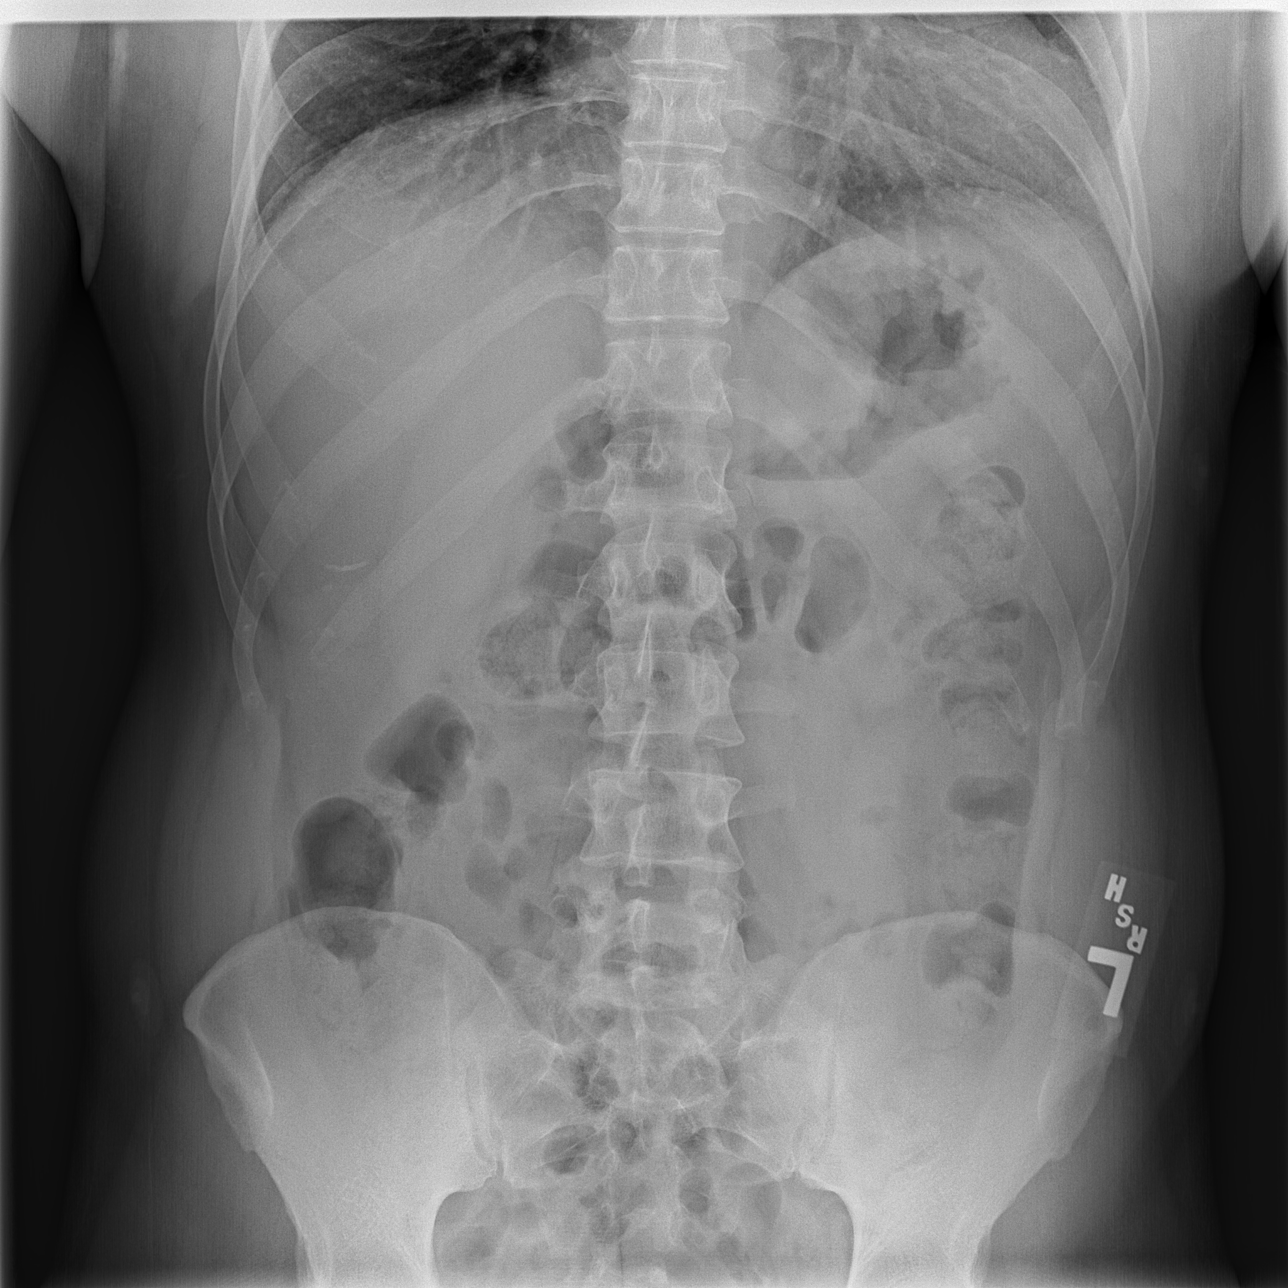

[t abdomen supine]
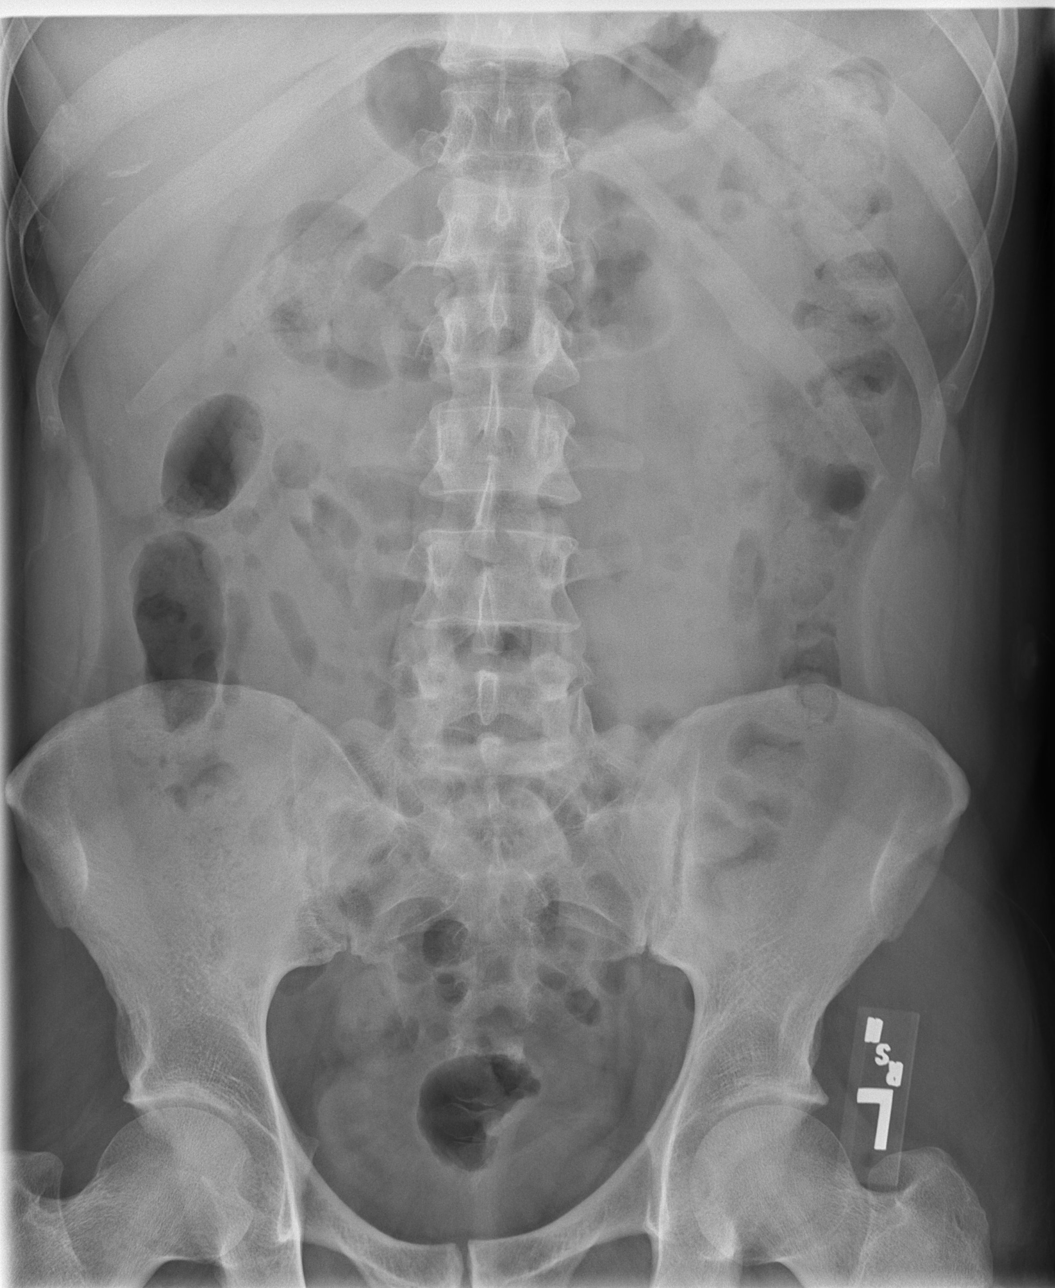

[3 of 3 positions shown; findings below may reference images not displayed]

FINDINGS: The cardiomediastinal silhouette is within normal limits. The lungs
are well inflated with mildly increased interstitial markings
bilaterally. No segmental airspace consolidation is seen. There is
no evidence of pleural effusion or pneumothorax.

There is no evidence of intraperitoneal free air. Gas is seen in
nondilated loops of small and large bowel without evidence of
obstruction. No air-fluid levels are seen. No abnormal soft tissue
calcification is identified. No acute osseous abnormality is seen.
IMPRESSION: 1. Mildly increased interstitial lung markings bilaterally,
nonspecific but can be seen in the setting of viral/atypical
infection.
2. Nonobstructed bowel-gas pattern.
